# Patient Record
Sex: Female | Born: 1970 | Race: Black or African American | Hispanic: No | Marital: Single | Smoking: Never smoker
Health system: Southern US, Community
[De-identification: ages and names within clinical notes are randomized; demographics above are authoritative.]

## PROBLEM LIST (undated history)

## (undated) DIAGNOSIS — R112 Nausea with vomiting, unspecified: Secondary | ICD-10-CM

## (undated) DIAGNOSIS — Z9889 Other specified postprocedural states: Secondary | ICD-10-CM

## (undated) DIAGNOSIS — Z21 Asymptomatic human immunodeficiency virus [HIV] infection status: Secondary | ICD-10-CM

## (undated) DIAGNOSIS — D649 Anemia, unspecified: Secondary | ICD-10-CM

## (undated) DIAGNOSIS — I10 Essential (primary) hypertension: Secondary | ICD-10-CM

## (undated) DIAGNOSIS — J45909 Unspecified asthma, uncomplicated: Secondary | ICD-10-CM

## (undated) HISTORY — PX: MYOMECTOMY: SHX85

---

## 1998-09-15 ENCOUNTER — Other Ambulatory Visit: Admission: RE | Admit: 1998-09-15 | Discharge: 1998-09-15 | Payer: Self-pay | Admitting: Obstetrics

## 1999-04-29 ENCOUNTER — Other Ambulatory Visit: Admission: RE | Admit: 1999-04-29 | Discharge: 1999-04-29 | Payer: Self-pay | Admitting: Obstetrics

## 1999-06-15 ENCOUNTER — Encounter (HOSPITAL_COMMUNITY): Payer: Self-pay | Admitting: Oncology

## 1999-06-15 ENCOUNTER — Ambulatory Visit (HOSPITAL_COMMUNITY): Admission: RE | Admit: 1999-06-15 | Discharge: 1999-06-15 | Payer: Self-pay | Admitting: Oncology

## 1999-08-02 ENCOUNTER — Emergency Department (HOSPITAL_COMMUNITY): Admission: EM | Admit: 1999-08-02 | Discharge: 1999-08-02 | Payer: Self-pay | Admitting: Emergency Medicine

## 1999-08-02 ENCOUNTER — Encounter: Payer: Self-pay | Admitting: Emergency Medicine

## 1999-09-10 ENCOUNTER — Encounter (INDEPENDENT_AMBULATORY_CARE_PROVIDER_SITE_OTHER): Payer: Self-pay | Admitting: *Deleted

## 1999-09-10 ENCOUNTER — Ambulatory Visit (HOSPITAL_COMMUNITY): Admission: RE | Admit: 1999-09-10 | Discharge: 1999-09-10 | Payer: Self-pay | Admitting: Internal Medicine

## 1999-09-10 ENCOUNTER — Encounter: Admission: RE | Admit: 1999-09-10 | Discharge: 1999-09-10 | Payer: Self-pay | Admitting: Internal Medicine

## 1999-09-10 LAB — CONVERTED CEMR LAB: CD4 Count: 9 microliters

## 1999-10-02 ENCOUNTER — Emergency Department (HOSPITAL_COMMUNITY): Admission: EM | Admit: 1999-10-02 | Discharge: 1999-10-02 | Payer: Self-pay | Admitting: Emergency Medicine

## 1999-10-02 ENCOUNTER — Encounter: Payer: Self-pay | Admitting: Emergency Medicine

## 1999-10-05 ENCOUNTER — Encounter: Admission: RE | Admit: 1999-10-05 | Discharge: 1999-10-05 | Payer: Self-pay | Admitting: Internal Medicine

## 1999-10-11 ENCOUNTER — Encounter: Admission: RE | Admit: 1999-10-11 | Discharge: 1999-10-11 | Payer: Self-pay | Admitting: Internal Medicine

## 1999-10-15 ENCOUNTER — Encounter: Admission: RE | Admit: 1999-10-15 | Discharge: 1999-10-15 | Payer: Self-pay | Admitting: Internal Medicine

## 1999-10-15 ENCOUNTER — Ambulatory Visit (HOSPITAL_COMMUNITY): Admission: RE | Admit: 1999-10-15 | Discharge: 1999-10-15 | Payer: Self-pay | Admitting: Internal Medicine

## 1999-10-26 ENCOUNTER — Ambulatory Visit (HOSPITAL_COMMUNITY): Admission: RE | Admit: 1999-10-26 | Discharge: 1999-10-26 | Payer: Self-pay | Admitting: Internal Medicine

## 1999-10-26 ENCOUNTER — Encounter: Payer: Self-pay | Admitting: Internal Medicine

## 1999-10-26 ENCOUNTER — Encounter: Admission: RE | Admit: 1999-10-26 | Discharge: 1999-10-26 | Payer: Self-pay | Admitting: Internal Medicine

## 1999-11-03 ENCOUNTER — Encounter: Admission: RE | Admit: 1999-11-03 | Discharge: 1999-11-03 | Payer: Self-pay | Admitting: Internal Medicine

## 1999-11-03 ENCOUNTER — Encounter (INDEPENDENT_AMBULATORY_CARE_PROVIDER_SITE_OTHER): Payer: Self-pay | Admitting: *Deleted

## 1999-11-03 ENCOUNTER — Ambulatory Visit (HOSPITAL_COMMUNITY): Admission: RE | Admit: 1999-11-03 | Discharge: 1999-11-03 | Payer: Self-pay | Admitting: Internal Medicine

## 1999-11-03 ENCOUNTER — Inpatient Hospital Stay (HOSPITAL_COMMUNITY): Admission: RE | Admit: 1999-11-03 | Discharge: 1999-11-19 | Payer: Self-pay | Admitting: Infectious Diseases

## 1999-11-04 ENCOUNTER — Encounter: Payer: Self-pay | Admitting: Infectious Diseases

## 1999-11-05 ENCOUNTER — Encounter: Payer: Self-pay | Admitting: Infectious Diseases

## 1999-11-08 ENCOUNTER — Encounter: Payer: Self-pay | Admitting: Infectious Diseases

## 1999-11-09 ENCOUNTER — Encounter: Payer: Self-pay | Admitting: Infectious Diseases

## 1999-11-15 ENCOUNTER — Encounter: Payer: Self-pay | Admitting: Infectious Diseases

## 1999-11-16 ENCOUNTER — Encounter: Payer: Self-pay | Admitting: Infectious Diseases

## 1999-11-22 ENCOUNTER — Encounter: Admission: RE | Admit: 1999-11-22 | Discharge: 1999-11-22 | Payer: Self-pay | Admitting: Internal Medicine

## 1999-11-22 ENCOUNTER — Ambulatory Visit (HOSPITAL_COMMUNITY): Admission: RE | Admit: 1999-11-22 | Discharge: 1999-11-22 | Payer: Self-pay | Admitting: Internal Medicine

## 1999-11-25 ENCOUNTER — Ambulatory Visit (HOSPITAL_COMMUNITY): Admission: RE | Admit: 1999-11-25 | Discharge: 1999-11-25 | Payer: Self-pay | Admitting: Internal Medicine

## 1999-11-25 ENCOUNTER — Encounter: Admission: RE | Admit: 1999-11-25 | Discharge: 1999-11-25 | Payer: Self-pay | Admitting: Internal Medicine

## 1999-11-29 ENCOUNTER — Ambulatory Visit (HOSPITAL_COMMUNITY): Admission: RE | Admit: 1999-11-29 | Discharge: 1999-11-29 | Payer: Self-pay | Admitting: Internal Medicine

## 1999-11-30 ENCOUNTER — Encounter: Admission: RE | Admit: 1999-11-30 | Discharge: 1999-11-30 | Payer: Self-pay | Admitting: Internal Medicine

## 1999-12-01 ENCOUNTER — Ambulatory Visit (HOSPITAL_COMMUNITY): Admission: RE | Admit: 1999-12-01 | Discharge: 1999-12-01 | Payer: Self-pay | Admitting: Internal Medicine

## 1999-12-01 ENCOUNTER — Encounter: Payer: Self-pay | Admitting: Internal Medicine

## 1999-12-14 ENCOUNTER — Ambulatory Visit (HOSPITAL_COMMUNITY): Admission: RE | Admit: 1999-12-14 | Discharge: 1999-12-14 | Payer: Self-pay | Admitting: Internal Medicine

## 1999-12-28 ENCOUNTER — Ambulatory Visit (HOSPITAL_COMMUNITY): Admission: RE | Admit: 1999-12-28 | Discharge: 1999-12-28 | Payer: Self-pay | Admitting: Internal Medicine

## 1999-12-28 ENCOUNTER — Encounter: Admission: RE | Admit: 1999-12-28 | Discharge: 1999-12-28 | Payer: Self-pay | Admitting: Internal Medicine

## 2000-02-01 ENCOUNTER — Ambulatory Visit (HOSPITAL_COMMUNITY): Admission: RE | Admit: 2000-02-01 | Discharge: 2000-02-01 | Payer: Self-pay | Admitting: Internal Medicine

## 2000-02-01 ENCOUNTER — Encounter: Admission: RE | Admit: 2000-02-01 | Discharge: 2000-02-01 | Payer: Self-pay | Admitting: Internal Medicine

## 2000-02-22 ENCOUNTER — Encounter: Admission: RE | Admit: 2000-02-22 | Discharge: 2000-02-22 | Payer: Self-pay | Admitting: Internal Medicine

## 2000-03-21 ENCOUNTER — Ambulatory Visit (HOSPITAL_COMMUNITY): Admission: RE | Admit: 2000-03-21 | Discharge: 2000-03-21 | Payer: Self-pay | Admitting: Internal Medicine

## 2000-03-21 ENCOUNTER — Encounter: Admission: RE | Admit: 2000-03-21 | Discharge: 2000-03-21 | Payer: Self-pay | Admitting: Internal Medicine

## 2000-05-09 ENCOUNTER — Encounter: Admission: RE | Admit: 2000-05-09 | Discharge: 2000-05-09 | Payer: Self-pay | Admitting: Internal Medicine

## 2000-06-26 ENCOUNTER — Encounter: Admission: RE | Admit: 2000-06-26 | Discharge: 2000-06-26 | Payer: Self-pay | Admitting: Internal Medicine

## 2000-06-26 ENCOUNTER — Ambulatory Visit (HOSPITAL_COMMUNITY): Admission: RE | Admit: 2000-06-26 | Discharge: 2000-06-26 | Payer: Self-pay | Admitting: Internal Medicine

## 2000-07-11 ENCOUNTER — Encounter: Admission: RE | Admit: 2000-07-11 | Discharge: 2000-07-11 | Payer: Self-pay | Admitting: Internal Medicine

## 2000-09-19 ENCOUNTER — Other Ambulatory Visit: Admission: RE | Admit: 2000-09-19 | Discharge: 2000-09-19 | Payer: Self-pay | Admitting: Obstetrics

## 2000-09-26 ENCOUNTER — Ambulatory Visit (HOSPITAL_COMMUNITY): Admission: RE | Admit: 2000-09-26 | Discharge: 2000-09-26 | Payer: Self-pay | Admitting: Internal Medicine

## 2000-09-26 ENCOUNTER — Encounter: Admission: RE | Admit: 2000-09-26 | Discharge: 2000-09-26 | Payer: Self-pay | Admitting: Internal Medicine

## 2000-10-10 ENCOUNTER — Encounter: Admission: RE | Admit: 2000-10-10 | Discharge: 2000-10-10 | Payer: Self-pay | Admitting: Internal Medicine

## 2000-11-20 ENCOUNTER — Ambulatory Visit (HOSPITAL_COMMUNITY): Admission: RE | Admit: 2000-11-20 | Discharge: 2000-11-20 | Payer: Self-pay | Admitting: Internal Medicine

## 2000-11-20 ENCOUNTER — Encounter: Admission: RE | Admit: 2000-11-20 | Discharge: 2000-11-20 | Payer: Self-pay | Admitting: Internal Medicine

## 2001-01-29 ENCOUNTER — Encounter: Admission: RE | Admit: 2001-01-29 | Discharge: 2001-01-29 | Payer: Self-pay | Admitting: Internal Medicine

## 2001-01-29 ENCOUNTER — Ambulatory Visit (HOSPITAL_COMMUNITY): Admission: RE | Admit: 2001-01-29 | Discharge: 2001-01-29 | Payer: Self-pay | Admitting: Internal Medicine

## 2001-02-13 ENCOUNTER — Encounter: Admission: RE | Admit: 2001-02-13 | Discharge: 2001-02-13 | Payer: Self-pay | Admitting: Internal Medicine

## 2001-03-13 ENCOUNTER — Encounter: Admission: RE | Admit: 2001-03-13 | Discharge: 2001-03-13 | Payer: Self-pay | Admitting: Obstetrics & Gynecology

## 2001-04-11 ENCOUNTER — Ambulatory Visit (HOSPITAL_COMMUNITY): Admission: RE | Admit: 2001-04-11 | Discharge: 2001-04-11 | Payer: Self-pay | Admitting: Internal Medicine

## 2001-04-11 ENCOUNTER — Encounter: Admission: RE | Admit: 2001-04-11 | Discharge: 2001-04-11 | Payer: Self-pay

## 2001-05-08 ENCOUNTER — Encounter: Admission: RE | Admit: 2001-05-08 | Discharge: 2001-05-08 | Payer: Self-pay | Admitting: Internal Medicine

## 2001-08-14 ENCOUNTER — Encounter: Admission: RE | Admit: 2001-08-14 | Discharge: 2001-08-14 | Payer: Self-pay | Admitting: Internal Medicine

## 2001-08-14 ENCOUNTER — Ambulatory Visit (HOSPITAL_COMMUNITY): Admission: RE | Admit: 2001-08-14 | Discharge: 2001-08-14 | Payer: Self-pay | Admitting: Internal Medicine

## 2001-08-28 ENCOUNTER — Encounter: Admission: RE | Admit: 2001-08-28 | Discharge: 2001-08-28 | Payer: Self-pay | Admitting: Internal Medicine

## 2001-11-13 ENCOUNTER — Ambulatory Visit (HOSPITAL_COMMUNITY): Admission: RE | Admit: 2001-11-13 | Discharge: 2001-11-13 | Payer: Self-pay | Admitting: Internal Medicine

## 2001-11-13 ENCOUNTER — Encounter: Admission: RE | Admit: 2001-11-13 | Discharge: 2001-11-13 | Payer: Self-pay | Admitting: Internal Medicine

## 2001-11-27 ENCOUNTER — Encounter: Admission: RE | Admit: 2001-11-27 | Discharge: 2001-11-27 | Payer: Self-pay | Admitting: Internal Medicine

## 2001-12-25 ENCOUNTER — Encounter: Admission: RE | Admit: 2001-12-25 | Discharge: 2001-12-25 | Payer: Self-pay | Admitting: Internal Medicine

## 2002-02-27 ENCOUNTER — Encounter: Admission: RE | Admit: 2002-02-27 | Discharge: 2002-02-27 | Payer: Self-pay | Admitting: Internal Medicine

## 2002-02-27 ENCOUNTER — Ambulatory Visit (HOSPITAL_COMMUNITY): Admission: RE | Admit: 2002-02-27 | Discharge: 2002-02-27 | Payer: Self-pay | Admitting: Internal Medicine

## 2002-03-13 ENCOUNTER — Encounter: Admission: RE | Admit: 2002-03-13 | Discharge: 2002-03-13 | Payer: Self-pay | Admitting: Internal Medicine

## 2002-04-03 ENCOUNTER — Ambulatory Visit (HOSPITAL_COMMUNITY): Admission: RE | Admit: 2002-04-03 | Discharge: 2002-04-03 | Payer: Self-pay | Admitting: Internal Medicine

## 2002-04-03 ENCOUNTER — Encounter: Admission: RE | Admit: 2002-04-03 | Discharge: 2002-04-03 | Payer: Self-pay | Admitting: Internal Medicine

## 2002-05-28 ENCOUNTER — Encounter: Admission: RE | Admit: 2002-05-28 | Discharge: 2002-05-28 | Payer: Self-pay | Admitting: Internal Medicine

## 2002-05-28 ENCOUNTER — Ambulatory Visit (HOSPITAL_COMMUNITY): Admission: RE | Admit: 2002-05-28 | Discharge: 2002-05-28 | Payer: Self-pay | Admitting: Internal Medicine

## 2002-06-11 ENCOUNTER — Encounter: Admission: RE | Admit: 2002-06-11 | Discharge: 2002-06-11 | Payer: Self-pay | Admitting: Internal Medicine

## 2002-12-05 ENCOUNTER — Ambulatory Visit (HOSPITAL_COMMUNITY): Admission: RE | Admit: 2002-12-05 | Discharge: 2002-12-05 | Payer: Self-pay | Admitting: Internal Medicine

## 2002-12-05 ENCOUNTER — Encounter: Payer: Self-pay | Admitting: Internal Medicine

## 2002-12-05 ENCOUNTER — Encounter: Admission: RE | Admit: 2002-12-05 | Discharge: 2002-12-05 | Payer: Self-pay | Admitting: Internal Medicine

## 2002-12-19 ENCOUNTER — Encounter: Admission: RE | Admit: 2002-12-19 | Discharge: 2002-12-19 | Payer: Self-pay | Admitting: Internal Medicine

## 2003-04-03 ENCOUNTER — Encounter: Payer: Self-pay | Admitting: Internal Medicine

## 2003-04-03 ENCOUNTER — Encounter: Admission: RE | Admit: 2003-04-03 | Discharge: 2003-04-03 | Payer: Self-pay | Admitting: Internal Medicine

## 2003-04-03 ENCOUNTER — Ambulatory Visit (HOSPITAL_COMMUNITY): Admission: RE | Admit: 2003-04-03 | Discharge: 2003-04-03 | Payer: Self-pay | Admitting: Internal Medicine

## 2003-04-22 ENCOUNTER — Encounter: Admission: RE | Admit: 2003-04-22 | Discharge: 2003-04-22 | Payer: Self-pay | Admitting: Internal Medicine

## 2003-08-15 ENCOUNTER — Encounter: Admission: RE | Admit: 2003-08-15 | Discharge: 2003-08-15 | Payer: Self-pay | Admitting: Family Medicine

## 2003-10-07 ENCOUNTER — Encounter: Admission: RE | Admit: 2003-10-07 | Discharge: 2003-10-07 | Payer: Self-pay | Admitting: Internal Medicine

## 2003-10-07 ENCOUNTER — Ambulatory Visit (HOSPITAL_COMMUNITY): Admission: RE | Admit: 2003-10-07 | Discharge: 2003-10-07 | Payer: Self-pay | Admitting: Internal Medicine

## 2003-10-21 ENCOUNTER — Encounter: Admission: RE | Admit: 2003-10-21 | Discharge: 2003-10-21 | Payer: Self-pay | Admitting: Internal Medicine

## 2004-04-07 ENCOUNTER — Ambulatory Visit (HOSPITAL_COMMUNITY): Admission: RE | Admit: 2004-04-07 | Discharge: 2004-04-07 | Payer: Self-pay | Admitting: Internal Medicine

## 2004-04-07 ENCOUNTER — Ambulatory Visit: Payer: Self-pay | Admitting: Internal Medicine

## 2004-05-04 ENCOUNTER — Ambulatory Visit: Payer: Self-pay | Admitting: Internal Medicine

## 2004-11-22 ENCOUNTER — Ambulatory Visit (HOSPITAL_COMMUNITY): Admission: RE | Admit: 2004-11-22 | Discharge: 2004-11-22 | Payer: Self-pay | Admitting: Internal Medicine

## 2004-11-22 ENCOUNTER — Ambulatory Visit: Payer: Self-pay | Admitting: Internal Medicine

## 2004-12-07 ENCOUNTER — Ambulatory Visit: Payer: Self-pay | Admitting: Internal Medicine

## 2005-05-17 ENCOUNTER — Ambulatory Visit: Payer: Self-pay | Admitting: Internal Medicine

## 2005-05-17 ENCOUNTER — Encounter (INDEPENDENT_AMBULATORY_CARE_PROVIDER_SITE_OTHER): Payer: Self-pay | Admitting: *Deleted

## 2005-05-17 ENCOUNTER — Ambulatory Visit (HOSPITAL_COMMUNITY): Admission: RE | Admit: 2005-05-17 | Discharge: 2005-05-17 | Payer: Self-pay | Admitting: Internal Medicine

## 2005-05-31 ENCOUNTER — Ambulatory Visit: Payer: Self-pay | Admitting: Internal Medicine

## 2006-01-02 ENCOUNTER — Encounter (INDEPENDENT_AMBULATORY_CARE_PROVIDER_SITE_OTHER): Payer: Self-pay | Admitting: *Deleted

## 2006-01-02 ENCOUNTER — Encounter: Admission: RE | Admit: 2006-01-02 | Discharge: 2006-01-02 | Payer: Self-pay | Admitting: Internal Medicine

## 2006-01-02 ENCOUNTER — Ambulatory Visit: Payer: Self-pay | Admitting: Internal Medicine

## 2006-01-02 LAB — CONVERTED CEMR LAB
CD4 Count: 710 microliters
HIV 1 RNA Quant: 399 copies/mL

## 2006-01-16 ENCOUNTER — Ambulatory Visit: Payer: Self-pay | Admitting: Internal Medicine

## 2006-06-26 ENCOUNTER — Ambulatory Visit: Payer: Self-pay | Admitting: Internal Medicine

## 2006-06-26 ENCOUNTER — Encounter: Admission: RE | Admit: 2006-06-26 | Discharge: 2006-06-26 | Payer: Self-pay | Admitting: Internal Medicine

## 2006-06-26 ENCOUNTER — Encounter (INDEPENDENT_AMBULATORY_CARE_PROVIDER_SITE_OTHER): Payer: Self-pay | Admitting: *Deleted

## 2006-06-26 LAB — CONVERTED CEMR LAB
AST: 17 units/L (ref 0–37)
Albumin: 4.2 g/dL (ref 3.5–5.2)
Alkaline Phosphatase: 55 units/L (ref 39–117)
BUN: 15 mg/dL (ref 6–23)
CD4 Count: 690 microliters
Calcium: 9 mg/dL (ref 8.4–10.5)
Chloride: 106 meq/L (ref 96–112)
Glucose, Bld: 80 mg/dL (ref 70–99)
HDL: 62 mg/dL (ref >39)
HIV 1 RNA Quant: 49 copies/mL
HIV-1 RNA Quant, Log: <1.7 (ref <1.70)
LDL Cholesterol: 73 mg/dL (ref 0–99)
Lymphocytes Relative: 42 % (ref 12–46)
Lymphs Abs: 2.2 10*3/uL (ref 0.7–3.3)
Neutrophils Relative %: 51 % (ref 43–77)
Platelets: 266 10*3/uL (ref 150–400)
Potassium: 3.7 meq/L (ref 3.5–5.3)
RDW: 12.7 % (ref 11.5–14.0)
Sodium: 141 meq/L (ref 135–145)
Total Protein: 7.2 g/dL (ref 6.0–8.3)
WBC: 5.1 10*3/uL (ref 4.0–10.5)

## 2006-07-11 ENCOUNTER — Ambulatory Visit: Payer: Self-pay | Admitting: Internal Medicine

## 2006-07-12 ENCOUNTER — Encounter: Payer: Self-pay | Admitting: Internal Medicine

## 2006-08-10 ENCOUNTER — Encounter: Payer: Self-pay | Admitting: Internal Medicine

## 2006-08-10 DIAGNOSIS — F329 Major depressive disorder, single episode, unspecified: Secondary | ICD-10-CM

## 2006-08-10 DIAGNOSIS — N920 Excessive and frequent menstruation with regular cycle: Secondary | ICD-10-CM | POA: Insufficient documentation

## 2006-08-10 DIAGNOSIS — Z862 Personal history of diseases of the blood and blood-forming organs and certain disorders involving the immune mechanism: Secondary | ICD-10-CM | POA: Insufficient documentation

## 2006-08-10 DIAGNOSIS — Z87898 Personal history of other specified conditions: Secondary | ICD-10-CM | POA: Insufficient documentation

## 2006-08-10 DIAGNOSIS — A630 Anogenital (venereal) warts: Secondary | ICD-10-CM | POA: Insufficient documentation

## 2006-08-10 DIAGNOSIS — M4120 Other idiopathic scoliosis, site unspecified: Secondary | ICD-10-CM | POA: Insufficient documentation

## 2006-08-10 DIAGNOSIS — D72819 Decreased white blood cell count, unspecified: Secondary | ICD-10-CM | POA: Insufficient documentation

## 2006-08-10 DIAGNOSIS — J45909 Unspecified asthma, uncomplicated: Secondary | ICD-10-CM | POA: Insufficient documentation

## 2006-08-10 DIAGNOSIS — F32A Depression, unspecified: Secondary | ICD-10-CM | POA: Insufficient documentation

## 2006-08-10 LAB — CONVERTED CEMR LAB
Hep B S Ab: NEGATIVE
Hepatitis B Surface Ag: NEGATIVE

## 2006-08-14 ENCOUNTER — Encounter (INDEPENDENT_AMBULATORY_CARE_PROVIDER_SITE_OTHER): Payer: Self-pay | Admitting: *Deleted

## 2006-08-14 ENCOUNTER — Encounter: Payer: Self-pay | Admitting: Internal Medicine

## 2006-08-14 LAB — CONVERTED CEMR LAB

## 2006-08-27 ENCOUNTER — Encounter (INDEPENDENT_AMBULATORY_CARE_PROVIDER_SITE_OTHER): Payer: Self-pay | Admitting: *Deleted

## 2006-10-19 ENCOUNTER — Telehealth: Payer: Self-pay | Admitting: Internal Medicine

## 2006-12-25 ENCOUNTER — Encounter: Admission: RE | Admit: 2006-12-25 | Discharge: 2006-12-25 | Payer: Self-pay | Admitting: Internal Medicine

## 2006-12-25 ENCOUNTER — Ambulatory Visit: Payer: Self-pay | Admitting: Internal Medicine

## 2006-12-26 ENCOUNTER — Encounter: Payer: Self-pay | Admitting: Internal Medicine

## 2006-12-26 ENCOUNTER — Encounter (INDEPENDENT_AMBULATORY_CARE_PROVIDER_SITE_OTHER): Payer: Self-pay | Admitting: *Deleted

## 2007-01-10 ENCOUNTER — Ambulatory Visit: Payer: Self-pay | Admitting: Internal Medicine

## 2007-01-10 DIAGNOSIS — B20 Human immunodeficiency virus [HIV] disease: Secondary | ICD-10-CM | POA: Insufficient documentation

## 2007-01-11 ENCOUNTER — Encounter (INDEPENDENT_AMBULATORY_CARE_PROVIDER_SITE_OTHER): Payer: Self-pay | Admitting: *Deleted

## 2007-01-19 ENCOUNTER — Encounter (INDEPENDENT_AMBULATORY_CARE_PROVIDER_SITE_OTHER): Payer: Self-pay | Admitting: *Deleted

## 2007-04-11 ENCOUNTER — Telehealth: Payer: Self-pay | Admitting: Internal Medicine

## 2007-04-12 ENCOUNTER — Telehealth: Payer: Self-pay | Admitting: Internal Medicine

## 2007-05-07 ENCOUNTER — Ambulatory Visit (HOSPITAL_COMMUNITY): Admission: RE | Admit: 2007-05-07 | Discharge: 2007-05-07 | Payer: Self-pay | Admitting: Obstetrics

## 2007-05-23 ENCOUNTER — Encounter (INDEPENDENT_AMBULATORY_CARE_PROVIDER_SITE_OTHER): Payer: Self-pay | Admitting: Obstetrics

## 2007-05-23 ENCOUNTER — Inpatient Hospital Stay (HOSPITAL_COMMUNITY): Admission: RE | Admit: 2007-05-23 | Discharge: 2007-05-25 | Payer: Self-pay | Admitting: Obstetrics

## 2007-06-18 ENCOUNTER — Encounter (INDEPENDENT_AMBULATORY_CARE_PROVIDER_SITE_OTHER): Payer: Self-pay | Admitting: *Deleted

## 2007-06-19 ENCOUNTER — Encounter (INDEPENDENT_AMBULATORY_CARE_PROVIDER_SITE_OTHER): Payer: Self-pay | Admitting: *Deleted

## 2007-06-26 ENCOUNTER — Ambulatory Visit: Payer: Self-pay | Admitting: Internal Medicine

## 2007-06-26 ENCOUNTER — Encounter: Admission: RE | Admit: 2007-06-26 | Discharge: 2007-06-26 | Payer: Self-pay | Admitting: Internal Medicine

## 2007-06-26 LAB — CONVERTED CEMR LAB
Albumin: 4.6 g/dL (ref 3.5–5.2)
BUN: 15 mg/dL (ref 6–23)
CO2: 24 meq/L (ref 19–32)
Cholesterol: 145 mg/dL (ref 0–200)
Glucose, Bld: 99 mg/dL (ref 70–99)
HIV-1 RNA Quant, Log: <1.7 (ref <1.70)
Hemoglobin: 13.8 g/dL (ref 12.0–15.0)
MCV: 93.8 fL (ref 78.0–100.0)
Potassium: 4.1 meq/L (ref 3.5–5.3)
RBC: 4.37 M/uL (ref 3.87–5.11)
Sodium: 140 meq/L (ref 135–145)
Total Bilirubin: 0.4 mg/dL (ref 0.3–1.2)
Total Protein: 7.6 g/dL (ref 6.0–8.3)
Triglycerides: 59 mg/dL (ref <150)
WBC: 6 10*3/uL (ref 4.0–10.5)

## 2007-07-10 ENCOUNTER — Ambulatory Visit: Payer: Self-pay | Admitting: Internal Medicine

## 2007-07-11 DIAGNOSIS — D219 Benign neoplasm of connective and other soft tissue, unspecified: Secondary | ICD-10-CM | POA: Insufficient documentation

## 2007-07-20 ENCOUNTER — Encounter: Payer: Self-pay | Admitting: Internal Medicine

## 2007-09-25 ENCOUNTER — Telehealth (INDEPENDENT_AMBULATORY_CARE_PROVIDER_SITE_OTHER): Payer: Self-pay | Admitting: *Deleted

## 2007-10-02 ENCOUNTER — Encounter: Payer: Self-pay | Admitting: Internal Medicine

## 2007-10-09 ENCOUNTER — Telehealth: Payer: Self-pay

## 2007-12-07 ENCOUNTER — Ambulatory Visit: Payer: Self-pay | Admitting: Internal Medicine

## 2007-12-07 ENCOUNTER — Encounter: Admission: RE | Admit: 2007-12-07 | Discharge: 2007-12-07 | Payer: Self-pay | Admitting: Internal Medicine

## 2007-12-07 LAB — CONVERTED CEMR LAB
BUN: 12 mg/dL (ref 6–23)
CO2: 22 meq/L (ref 19–32)
Creatinine, Ser: 0.74 mg/dL (ref 0.40–1.20)
Eosinophils Relative: 1 % (ref 0–5)
Glucose, Bld: 78 mg/dL (ref 70–99)
HCT: 42.2 % (ref 36.0–46.0)
HIV 1 RNA Quant: <50 copies/mL (ref <50)
HIV-1 RNA Quant, Log: <1.7 (ref <1.70)
Hemoglobin: 14.4 g/dL (ref 12.0–15.0)
Lymphocytes Relative: 33 % (ref 12–46)
Lymphs Abs: 1.8 10*3/uL (ref 0.7–4.0)
Monocytes Absolute: 0.2 10*3/uL (ref 0.1–1.0)
Monocytes Relative: 4 % (ref 3–12)
RBC: 4.67 M/uL (ref 3.87–5.11)
Sodium: 141 meq/L (ref 135–145)
Total Bilirubin: 0.5 mg/dL (ref 0.3–1.2)
Total Protein: 7 g/dL (ref 6.0–8.3)
WBC: 5.5 10*3/uL (ref 4.0–10.5)

## 2007-12-18 ENCOUNTER — Encounter (INDEPENDENT_AMBULATORY_CARE_PROVIDER_SITE_OTHER): Payer: Self-pay | Admitting: *Deleted

## 2007-12-25 ENCOUNTER — Encounter (INDEPENDENT_AMBULATORY_CARE_PROVIDER_SITE_OTHER): Payer: Self-pay | Admitting: *Deleted

## 2007-12-25 ENCOUNTER — Ambulatory Visit: Payer: Self-pay | Admitting: Internal Medicine

## 2007-12-25 LAB — CONVERTED CEMR LAB: Pap Smear: NORMAL

## 2008-01-03 ENCOUNTER — Ambulatory Visit (HOSPITAL_COMMUNITY): Admission: RE | Admit: 2008-01-03 | Discharge: 2008-01-04 | Payer: Self-pay | Admitting: Obstetrics

## 2008-01-03 ENCOUNTER — Encounter: Payer: Self-pay | Admitting: Internal Medicine

## 2008-06-18 ENCOUNTER — Ambulatory Visit: Payer: Self-pay | Admitting: Internal Medicine

## 2008-06-18 LAB — CONVERTED CEMR LAB
ALT: 11 units/L (ref 0–35)
AST: 14 units/L (ref 0–37)
Albumin: 4.4 g/dL (ref 3.5–5.2)
BUN: 15 mg/dL (ref 6–23)
Basophils Absolute: 0 10*3/uL (ref 0.0–0.1)
Basophils Relative: 0 % (ref 0–1)
Calcium: 8.7 mg/dL (ref 8.4–10.5)
Chloride: 103 meq/L (ref 96–112)
HIV 1 RNA Quant: <48 copies/mL (ref <48)
HIV-1 RNA Quant, Log: <1.68 (ref <1.68)
MCHC: 34 g/dL (ref 30.0–36.0)
Monocytes Relative: 7 % (ref 3–12)
Neutro Abs: 3 10*3/uL (ref 1.7–7.7)
Neutrophils Relative %: 54 % (ref 43–77)
Potassium: 3.8 meq/L (ref 3.5–5.3)
RBC: 4.56 M/uL (ref 3.87–5.11)
Total Protein: 7.4 g/dL (ref 6.0–8.3)

## 2008-07-08 ENCOUNTER — Ambulatory Visit: Payer: Self-pay | Admitting: Internal Medicine

## 2008-07-23 ENCOUNTER — Encounter (INDEPENDENT_AMBULATORY_CARE_PROVIDER_SITE_OTHER): Payer: Self-pay | Admitting: *Deleted

## 2008-12-10 ENCOUNTER — Ambulatory Visit: Payer: Self-pay | Admitting: Internal Medicine

## 2008-12-10 LAB — CONVERTED CEMR LAB
ALT: 14 units/L (ref 0–35)
Alkaline Phosphatase: 67 units/L (ref 39–117)
Basophils Absolute: 0 10*3/uL (ref 0.0–0.1)
Eosinophils Relative: 1 % (ref 0–5)
GFR calc non Af Amer: >60 mL/min (ref >60)
HCT: 40.7 % (ref 36.0–46.0)
HIV-1 RNA Quant, Log: <1.68 (ref <1.68)
Hemoglobin: 13.8 g/dL (ref 12.0–15.0)
LDL Cholesterol: 84 mg/dL (ref 0–99)
Lymphocytes Relative: 35 % (ref 12–46)
MCHC: 33.9 g/dL (ref 30.0–36.0)
Monocytes Absolute: 0.4 10*3/uL (ref 0.1–1.0)
Monocytes Relative: 6 % (ref 3–12)
RBC: 4.47 M/uL (ref 3.87–5.11)
RDW: 12.8 % (ref 11.5–15.5)
Sodium: 142 meq/L (ref 135–145)
Total Bilirubin: 0.5 mg/dL (ref 0.3–1.2)
Total CHOL/HDL Ratio: 2.5
Total Protein: 7.5 g/dL (ref 6.0–8.3)
Triglycerides: 43 mg/dL (ref <150)
VLDL: 9 mg/dL (ref 0–40)

## 2008-12-23 ENCOUNTER — Ambulatory Visit: Payer: Self-pay | Admitting: Internal Medicine

## 2008-12-29 ENCOUNTER — Encounter (INDEPENDENT_AMBULATORY_CARE_PROVIDER_SITE_OTHER): Payer: Self-pay | Admitting: *Deleted

## 2008-12-31 ENCOUNTER — Encounter: Payer: Self-pay | Admitting: Internal Medicine

## 2009-01-06 ENCOUNTER — Encounter (INDEPENDENT_AMBULATORY_CARE_PROVIDER_SITE_OTHER): Payer: Self-pay | Admitting: *Deleted

## 2009-02-03 ENCOUNTER — Encounter (INDEPENDENT_AMBULATORY_CARE_PROVIDER_SITE_OTHER): Payer: Self-pay | Admitting: *Deleted

## 2009-04-09 ENCOUNTER — Encounter: Payer: Self-pay | Admitting: Internal Medicine

## 2009-07-07 ENCOUNTER — Ambulatory Visit: Payer: Self-pay | Admitting: Internal Medicine

## 2009-07-07 LAB — CONVERTED CEMR LAB
AST: 18 units/L (ref 0–37)
Albumin: 4.7 g/dL (ref 3.5–5.2)
BUN: 17 mg/dL (ref 6–23)
Basophils Absolute: 0 10*3/uL (ref 0.0–0.1)
CO2: 23 meq/L (ref 19–32)
Calcium: 9.7 mg/dL (ref 8.4–10.5)
Chloride: 104 meq/L (ref 96–112)
Cholesterol: 163 mg/dL (ref 0–200)
Creatinine, Ser: 0.87 mg/dL (ref 0.40–1.20)
Eosinophils Relative: 1 % (ref 0–5)
Glucose, Bld: 89 mg/dL (ref 70–99)
HCT: 40.9 % (ref 36.0–46.0)
HDL: 71 mg/dL (ref >39)
HIV-1 RNA Quant, Log: <1.68 (ref <1.68)
Hemoglobin: 13.7 g/dL (ref 12.0–15.0)
LDL Cholesterol: 84 mg/dL (ref 0–99)
Lymphocytes Relative: 35 % (ref 12–46)
Lymphs Abs: 2.6 10*3/uL (ref 0.7–4.0)
Monocytes Absolute: 0.4 10*3/uL (ref 0.1–1.0)
Monocytes Relative: 6 % (ref 3–12)
Neutro Abs: 4.4 10*3/uL (ref 1.7–7.7)
Potassium: 3.6 meq/L (ref 3.5–5.3)
RBC: 4.51 M/uL (ref 3.87–5.11)
Triglycerides: 38 mg/dL (ref <150)
VLDL: 8 mg/dL (ref 0–40)
WBC: 7.5 10*3/uL (ref 4.0–10.5)

## 2009-07-21 ENCOUNTER — Ambulatory Visit: Payer: Self-pay | Admitting: Internal Medicine

## 2009-07-21 DIAGNOSIS — R809 Proteinuria, unspecified: Secondary | ICD-10-CM | POA: Insufficient documentation

## 2009-07-22 ENCOUNTER — Encounter: Payer: Self-pay | Admitting: Internal Medicine

## 2009-12-28 ENCOUNTER — Ambulatory Visit: Payer: Self-pay | Admitting: Internal Medicine

## 2009-12-28 LAB — CONVERTED CEMR LAB
Eosinophils Absolute: 0 10*3/uL (ref 0.0–0.7)
Eosinophils Relative: 0 % (ref 0–5)
HCT: 38.3 % (ref 36.0–46.0)
Hemoglobin: 12.6 g/dL (ref 12.0–15.0)
Lymphocytes Relative: 35 % (ref 12–46)
Lymphs Abs: 2.3 10*3/uL (ref 0.7–4.0)
MCV: 92.7 fL (ref 78.0–100.0)
Monocytes Absolute: 0.5 10*3/uL (ref 0.1–1.0)
Platelets: 314 10*3/uL (ref 150–400)
RDW: 13.3 % (ref 11.5–15.5)
WBC: 6.7 10*3/uL (ref 4.0–10.5)

## 2010-02-18 ENCOUNTER — Encounter (INDEPENDENT_AMBULATORY_CARE_PROVIDER_SITE_OTHER): Payer: Self-pay | Admitting: *Deleted

## 2010-03-08 ENCOUNTER — Ambulatory Visit: Payer: Self-pay | Admitting: Internal Medicine

## 2010-03-24 ENCOUNTER — Encounter: Payer: Self-pay | Admitting: Internal Medicine

## 2010-07-18 LAB — CONVERTED CEMR LAB
ALT: 11 units/L (ref 0–35)
Alkaline Phosphatase: 61 units/L (ref 39–117)
Basophils Absolute: 0 10*3/uL (ref 0.0–0.1)
Basophils Relative: 1 % (ref 0–1)
CO2: 27 meq/L (ref 19–32)
Creatinine, Ser: 0.8 mg/dL (ref 0.40–1.20)
Eosinophils Absolute: 0 10*3/uL (ref 0.0–0.7)
Eosinophils Relative: 1 % (ref 0–5)
HCT: 43.2 % (ref 36.0–46.0)
HIV 1 RNA Quant: <50 copies/mL (ref <50)
Hemoglobin: 14.2 g/dL (ref 12.0–15.0)
Lymphocytes Relative: 44 % (ref 12–46)
MCHC: 32.9 g/dL (ref 30.0–36.0)
MCV: 95.6 fL (ref 78.0–100.0)
Monocytes Absolute: 0.3 10*3/uL (ref 0.2–0.7)
Platelets: 275 10*3/uL (ref 150–400)
RDW: 12.4 % (ref 11.5–14.0)
Sodium: 140 meq/L (ref 135–145)
Total Bilirubin: 0.4 mg/dL (ref 0.3–1.2)

## 2010-07-22 NOTE — Miscellaneous (Signed)
Summary: clinical upate/ryan white  Clinical Lists Changes  Observations: Added new observation of PCTFPL: 578.17  (02/18/2010 14:21) Added new observation of HOUSEINCOME: 81191  (02/18/2010 14:21) Added new observation of FINASSESSDT: 12/28/2009  (02/18/2010 14:21) Added new observation of YEARLYEXPEN: 47829  (02/18/2010 14:21)

## 2010-07-22 NOTE — Assessment & Plan Note (Signed)
Summary: F/U OV/VS   CC:  follow-up visit and Depression.  History of Present Illness: Bailey Morris is in for her routine visit.  She think she's only missed one dose of her Atripla in the past 6 months when she fell asleep early.  She had a normal Pap smear this past summer by Dr. Gaynell Face.  She is still enjoying her work.  She is not dating and she is not sexually active.  Depression History:      The patient denies a depressed mood most of the day and a diminished interest in her usual daily activities.         Preventive Screening-Counseling & Management  Alcohol-Tobacco     Alcohol drinks/day: <1     Alcohol type: wine     Smoking Status: never     Passive Smoke Exposure: yes  Caffeine-Diet-Exercise     Caffeine use/day: yes     Does Patient Exercise: yes     Type of exercise: P90X, at home, FIT TV     Exercise (avg: min/session): 30-60     Times/week: 4  Hep-HIV-STD-Contraception     HIV Risk: no risk noted  Safety-Violence-Falls     Seat Belt Use: yes  Comments: declined condoms      Sexual History:  n/a.        Drug Use:  never.     Prior Medication List:  ATRIPLA 600-200-300 MG TABS (EFAVIRENZ-EMTRICITAB-TENOFOVIR) Take 1 tablet by mouth once a day XYZAL 5 MG TABS (LEVOCETIRIZINE DIHYDROCHLORIDE) Take 1 tablet by mouth once a day SINGULAIR 10 MG TABS (MONTELUKAST SODIUM) Take 1 tablet by mouth at bedtime OMNARIS 50 MCG/ACT SUSP (CICLESONIDE)    Current Allergies (reviewed today): ! SEPTRA ! * ADVAIR Vital Signs:  Patient profile:   40 year old female Menstrual status:  regular LMP:     03/02/2010 Height:      65 inches (165.10 cm) Weight:      160.75 pounds (73.07 kg) BMI:     26.85 Temp:     98.4 degrees F (36.89 degrees C) oral Pulse rate:   69 / minute BP sitting:   122 / 89  (left arm) Cuff size:   regular  Vitals Entered By: Jennet Maduro RN (March 08, 2010 3:32 PM) CC: follow-up visit, Depression Is Patient Diabetic? No Pain  Assessment Patient in pain? no      Nutritional Status BMI of 25 - 29 = overweight Nutritional Status Detail appetite "good"  Have you ever been in a relationship where you felt threatened, hurt or afraid?No   Does patient need assistance? Functional Status Self care Ambulation Normal Comments may have missed one dose of rxes LMP (date): 03/02/2010     Enter LMP: 03/02/2010 Last PAP Result Negative fo intraepithelial lesion and malignancy        Medication Adherence: 03/08/2010   Adherence to medications reviewed with patient. Counseling to provide adequate adherence provided   Prevention For Positives: 03/08/2010   Safe sex practices discussed with patient. Condoms offered.                             Physical Exam  General:  alert and well-nourished.  her weight is up 10 pounds. Mouth:  good dentition and pharynx pink and moist.  no erythema and no exudates.   Lungs:  normal breath sounds.  no crackles and no wheezes.   Heart:  normal rate, regular rhythm, and no  murmur.     Impression & Recommendations:  Problem # 1:  HIV DISEASE (ICD-042) [name]'s HIV infection remains under excellent control.  I will continue her current regimen. Diagnostics Reviewed:  HIV: CDC-defined AIDS (12/23/2008)   CD4: 710 (12/29/2009)   WBC: 6.7 (12/28/2009)   Hgb: 12.6 (12/28/2009)   HCT: 38.3 (12/28/2009)   Platelets: 314 (12/28/2009) HIV-1 RNA: <48 copies/mL (12/28/2009)   HBSAg: No (08/14/2006)  Other Orders: Est. Patient Level III (16109) Future Orders: T-CD4SP ([facility]) (CD4SP) ... 09/04/2010 T-HIV Viral Load [phone]) ... 09/04/2010 T-Comprehensive Metabolic Panel [phone]) ... 09/04/2010 T-CBC w/Diff ([id number]) ... 09/04/2010 T-RPR (Syphilis) [phone]) ... 09/04/2010 T-Lipid Profile [phone]) ... 09/04/2010  Patient Instructions: 1)  Please schedule a follow-up appointment in 6 months.    Not Administered:    Influenza Vaccine not given  Pt.  to obtain Flu vaccine at ther office later this month. Jennet Maduro RN  March 08, 2010 3:39 PM

## 2010-07-22 NOTE — Consult Note (Signed)
Summary: [facility]   [facility]   Imported By: Florinda Marker 04/06/2010 16:04:46  _____________________________________________________________________  External Attachment:    Type:   Image     Comment:   External Document

## 2010-07-22 NOTE — Consult Note (Signed)
Summary: [facility]  [facility]   Imported By: Florinda Marker 08/07/2009 14:59:05  _____________________________________________________________________  External Attachment:    Type:   Image     Comment:   External Document

## 2010-07-22 NOTE — Assessment & Plan Note (Signed)
Summary: resfrom pm to am/kamschedule dhange   CC:  follow-up visit and Depression.  History of Present Illness: Bailey Morris is in for her routine visit. She is happy that she has gotten a promotion at [employer] and now is working in Technical [employer].  She denies missing any doses of her Atripla  since her last visit.  She does want to lose some weight and hopes to get back to regular exercise.  She cut out drinking sodas as frequently as she had been recently.  She was screened for kidney disease because her brother was going under the transplant and recalls being told that she had protein in her urine.  Depression History:      The patient denies a depressed mood most of the day and a diminished interest in her usual daily activities.         Preventive Screening-Counseling & Management  Alcohol-Tobacco     Alcohol drinks/day: <1     Alcohol type: wine     Smoking Status: never     Passive Smoke Exposure: yes  Caffeine-Diet-Exercise     Caffeine use/day: yes     Does Patient Exercise: yes     Type of exercise: P90X, at home, FIT TV     Exercise (avg: min/session): 30-60     Times/week: 4  Hep-HIV-STD-Contraception     HIV Risk: no risk noted  Safety-Violence-Falls     Seat Belt Use: yes  Comments: declined condoms      Sexual History:  n/a.        Drug Use:  never.     Updated Prior Medication List: ATRIPLA 600-200-300 MG TABS (EFAVIRENZ-EMTRICITAB-TENOFOVIR) Take 1 tablet by mouth once a day XYZAL 5 MG TABS (LEVOCETIRIZINE DIHYDROCHLORIDE) Take 1 tablet by mouth once a day SINGULAIR 10 MG TABS (MONTELUKAST SODIUM) Take 1 tablet by mouth at bedtime OMNARIS 50 MCG/ACT SUSP (CICLESONIDE)   Current Allergies (reviewed today): ! SEPTRA Social History: Sexual History:  n/a  Vital Signs:  Patient profile:   40 year old female Menstrual status:  regular LMP:     06/19/2009 Height:      65 inches (165.10 cm) Weight:      156.4 pounds (71.09 kg) BMI:     26.12 Temp:      98.3 degrees F (36.83 degrees C) oral Pulse rate:   78 / minute BP sitting:   118 / 81  (right arm) Cuff size:   regular  Vitals Entered By: Jennet Maduro RN (July 21, 2009 10:25 AM) CC: follow-up visit, Depression Is Patient Diabetic? No Pain Assessment Patient in pain? no      Nutritional Status BMI of 25 - 29 = overweight Nutritional Status Detail appetite "GOOD"  Have you ever been in a relationship where you felt threatened, hurt or afraid?No   Does patient need assistance? Functional Status Self care Ambulation Normal Comments missed 2 doses of rx over 5 months ago LMP (date): 06/19/2009     Enter LMP: 06/19/2009 Last PAP Result Negative fo intraepithelial lesion and malignancy        Medication Adherence: 07/21/2009   Adherence to medications reviewed with patient. Counseling to provide adequate adherence provided                                Physical Exam  General:  alert and well-nourished.  her weight is up 10 pounds. Mouth:  good dentition and pharynx pink  and moist.  no erythema and no exudates.   Lungs:  normal breath sounds.  no crackles and no wheezes.   Heart:  normal rate, regular rhythm, and no murmur.   Skin:  no rashes.   Psych:  normally interactive, good eye contact, not anxious appearing, and not depressed appearing.     Impression & Recommendations:  Problem # 1:  HIV DISEASE (ICD-042) Her HIV infection remains under excellent control.  I will not make any changes today Diagnostics Reviewed:  HIV: CDC-defined AIDS (12/23/2008)   CD4: 900 (07/08/2009)   WBC: 7.5 (07/07/2009)   Hgb: 13.7 (07/07/2009)   HCT: 40.9 (07/07/2009)   Platelets: 333 (07/07/2009) HIV-1 RNA: <48 copies/mL (07/07/2009)   HBSAg: No (08/14/2006)  Problem # 2:  PROTEINURIA (ICD-791.0) I will check a urinalysis to screen for proteinuria. Orders: Est. Patient Level IV (16109) T-Urinalysis ([id number]) T-Urinalysis Dipstick only (91478GN)  Medications  Added to Medication List This Visit: 1)  Xyzal 5 Mg Tabs (Levocetirizine dihydrochloride) .... Take 1 tablet by mouth once a day 2)  Omnaris 50 Mcg/act Susp (Ciclesonide)  Other Orders: Future Orders: T-CD4SP ([facility]) (CD4SP) ... 01/17/2010 T-HIV Viral Load [id number]) ... 01/17/2010 T-CBC w/Diff ([id number]) ... 01/17/2010  Patient Instructions: 1)  Please schedule a follow-up appointment in 6 months.  Process Orders Check Orders Results:     [facility]: ABN not required for this insurance Tests Sent for requisitioning (July 21, 2009 11:14 AM):     01/17/2010: [facility] -- T-HIV Viral Load [phone] (signed)     01/17/2010: [facility] -- T-CBC w/Diff [[id number]] (signed)     07/21/2009: [facility] -- T-Urinalysis [[id number]] (signed)    Influenza Immunization History:    Influenza # 1:  Historical (03/20/2009)   Appended Document: resfrom pm to am/kamschedule dhange  Laboratory Results   Urine Tests  Date/Time Recieved: 07/21/2009 @ 1115 Date/Time Reported: 07/21/2009 @1119  Angelique Blonder Estridge RN  July 21, 2009 11:21 AM   Routine Urinalysis   Color: lt. yellow Appearance: Clear Glucose: negative   (Normal Range: Negative) Bilirubin: negative   (Normal Range: Negative) Ketone: negative   (Normal Range: Negative) Spec. Gravity: 1.015   (Normal Range: 1.003-1.035) Blood: trace-intact   (Normal Range: Negative) pH: 5.0   (Normal Range: 5.0-8.0) Protein: negative   (Normal Range: Negative) Urobilinogen: 0.2   (Normal Range: 0-1) Nitrite: negative   (Normal Range: Negative) Leukocyte Esterace: negative   (Normal Range: Negative)

## 2010-08-05 ENCOUNTER — Encounter (INDEPENDENT_AMBULATORY_CARE_PROVIDER_SITE_OTHER): Payer: Self-pay | Admitting: *Deleted

## 2010-08-09 ENCOUNTER — Encounter (INDEPENDENT_AMBULATORY_CARE_PROVIDER_SITE_OTHER): Payer: Self-pay | Admitting: *Deleted

## 2010-08-11 ENCOUNTER — Telehealth (INDEPENDENT_AMBULATORY_CARE_PROVIDER_SITE_OTHER): Payer: Self-pay | Admitting: *Deleted

## 2010-08-11 NOTE — Miscellaneous (Addendum)
  Clinical Lists Changes 

## 2010-08-17 ENCOUNTER — Other Ambulatory Visit: Payer: Self-pay | Admitting: Internal Medicine

## 2010-08-17 ENCOUNTER — Other Ambulatory Visit (INDEPENDENT_AMBULATORY_CARE_PROVIDER_SITE_OTHER): Payer: Managed Care, Other (non HMO)

## 2010-08-17 ENCOUNTER — Encounter: Payer: Self-pay | Admitting: Internal Medicine

## 2010-08-17 DIAGNOSIS — B20 Human immunodeficiency virus [HIV] disease: Secondary | ICD-10-CM

## 2010-08-17 LAB — CONVERTED CEMR LAB
ALT: 11 units/L (ref 0–35)
Albumin: 4 g/dL (ref 3.5–5.2)
CO2: 26 meq/L (ref 19–32)
Calcium: 9.1 mg/dL (ref 8.4–10.5)
Chloride: 104 meq/L (ref 96–112)
Cholesterol: 155 mg/dL (ref 0–200)
Eosinophils Absolute: 0 10*3/uL (ref 0.0–0.7)
Glucose, Bld: 91 mg/dL (ref 70–99)
HDL: 62 mg/dL (ref >39)
HIV-1 RNA Quant, Log: <1.3 (ref <1.30)
Lymphocytes Relative: 38 % (ref 12–46)
Lymphs Abs: 2 10*3/uL (ref 0.7–4.0)
Monocytes Relative: 6 % (ref 3–12)
Neutro Abs: 2.9 10*3/uL (ref 1.7–7.7)
Neutrophils Relative %: 55 % (ref 43–77)
Platelets: 291 10*3/uL (ref 150–400)
RBC: 4.42 M/uL (ref 3.87–5.11)
Sodium: 138 meq/L (ref 135–145)
Total CHOL/HDL Ratio: 2.5
Total Protein: 7.2 g/dL (ref 6.0–8.3)
Triglycerides: 55 mg/dL (ref <150)
VLDL: 11 mg/dL (ref 0–40)
WBC: 5.3 10*3/uL (ref 4.0–10.5)

## 2010-08-17 NOTE — Progress Notes (Signed)
Summary: Pt. needs f/u appt. w/ Dr. Orvan Falconer, lab work and PAP smear  Phone Note Outgoing Call   Call placed by: Jennet Maduro RN,  August 11, 2010 5:04 PM Call placed to: Patient Summary of Call: Message left for pt. to schedule f/u appt. w/ Dr. Orvan Falconer, lab work and PAP smear. Jennet Maduro RN  August 11, 2010 5:04 PM

## 2010-08-17 NOTE — Miscellaneous (Signed)
  Clinical Lists Changes  Observations: Added new observation of PCTFPL: 462.54  (08/09/2010 10:08) Added new observation of HOUSEINCOME: 16109  (08/09/2010 10:08)      NET INCOME - 60454

## 2010-08-18 LAB — T-HELPER CELL (CD4) - (RCID CLINIC ONLY): CD4 % Helper T Cell: 36 % (ref 33–55)

## 2010-08-31 ENCOUNTER — Encounter: Payer: Self-pay | Admitting: Internal Medicine

## 2010-08-31 ENCOUNTER — Ambulatory Visit (INDEPENDENT_AMBULATORY_CARE_PROVIDER_SITE_OTHER): Payer: Managed Care, Other (non HMO) | Admitting: Internal Medicine

## 2010-08-31 DIAGNOSIS — B20 Human immunodeficiency virus [HIV] disease: Secondary | ICD-10-CM

## 2010-09-06 LAB — T-HELPER CELL (CD4) - (RCID CLINIC ONLY)
CD4 % Helper T Cell: 39 % (ref 33–55)
CD4 T Cell Abs: 900 uL (ref 400–2700)

## 2010-09-07 NOTE — Assessment & Plan Note (Signed)
Summary: 64month f/u   Vital Signs:  Patient profile:   40 year old female Menstrual status:  regular LMP:     08/22/2010 Height:      65 inches (165.10 cm) Weight:      159.25 pounds (72.39 kg) BMI:     26.60 Temp:     97.9 degrees F (36.61 degrees C) oral Pulse rate:   81 / minute BP sitting:   130 / 84  (left arm) Cuff size:   regular  Vitals Entered By: Jennet Maduro RN (August 31, 2010 10:00 AM) CC: follow-up visit Is Patient Diabetic? No Pain Assessment Patient in pain? no      Nutritional Status BMI of 25 - 29 = overweight Nutritional Status Detail appetite "OK"  Have you ever been in a relationship where you felt threatened, hurt or afraid?No   Does patient need assistance? Functional Status Self care Ambulation Normal Comments no missed doses LMP (date): 08/22/2010     Enter LMP: 08/22/2010 Last PAP Result Negative fo intraepithelial lesion and malignancy   CC:  follow-up visit.  History of Present Illness: Bailey Morris is in for her routine visit.  She is not changed any of her medications and never misses a single dose.  She says that work is just so swollen that something just beyond her control.  She is still not dating and is not sexually active.  Preventive Screening-Counseling & Management  Alcohol-Tobacco     Alcohol drinks/day: <1     Alcohol type: wine     Smoking Status: never     Passive Smoke Exposure: yes  Caffeine-Diet-Exercise     Caffeine use/day: yes     Does Patient Exercise: no     Type of exercise: P90X, at home, FIT TV     Exercise (avg: min/session): 30-60     Times/week: 4     Exercise Counseling: to improve exercise regimen  Hep-HIV-STD-Contraception     HIV Risk: no risk noted     HIV Risk Counseling: not indicated-no HIV risk noted  Safety-Violence-Falls     Seat Belt Use: yes  Comments: declined condoms      Sexual History:  n/a.        Drug Use:  never.    Current Medications (verified): 1)  Atripla 600-200-300  Mg Tabs (Efavirenz-Emtricitab-Tenofovir) .... Take 1 Tablet By Mouth Once A Day 2)  Xyzal 5 Mg Tabs (Levocetirizine Dihydrochloride) .... Take 1 Tablet By Mouth Once A Day 3)  Singulair 10 Mg Tabs (Montelukast Sodium) .... Take 1 Tablet By Mouth At Bedtime 4)  Omnaris 50 Mcg/act Susp (Ciclesonide)  Allergies: 1)  ! Septra 2)  ! * Advair  Physical Exam  General:  alert and well-nourished.   Mouth:  good dentition and pharynx pink and moist.  no erythema and no exudates.   Lungs:  normal breath sounds.  no crackles and no wheezes.   Heart:  normal rate, regular rhythm, and no murmur.   Psych:  normally interactive, good eye contact, not anxious appearing, and not depressed appearing.          Medication Adherence: 08/31/2010   Adherence to medications reviewed with patient. Counseling to provide adequate adherence provided                                 Impression & Recommendations:  Problem # 1:  HIV DISEASE (ICD-042) Her infection remains under excellent control.  I will continue her current regimen. Diagnostics Reviewed:  HIV: CDC-defined AIDS (12/23/2008)   CD4: 770 (08/18/2010)   WBC: 5.3 (08/17/2010)   Hgb: 13.5 (08/17/2010)   HCT: 39.4 (08/17/2010)   Platelets: 291 (08/17/2010) HIV-1 RNA: <20 copies/mL (08/17/2010)   HBSAg: No (08/14/2006)  Other Orders: Est. Patient Level III (16109) Future Orders: T-CD4SP ([facility]) (CD4SP) ... 02/27/2011 T-HIV Viral Load [phone]) ... 02/27/2011  Patient Instructions: 1)  Please schedule a follow-up appointment in 6 months.   Orders Added: 1)  T-CD4SP ([facility]) [CD4SP] 2)  T-HIV Viral Load [phone] 3)  Est. Patient Level III [13086]   Immunization History:  Influenza Immunization History:    Influenza:  historical (03/08/2010)   Immunization History:  Influenza Immunization History:    Influenza:  Historical (03/08/2010)        Medication Adherence: 08/31/2010   Adherence to medications reviewed  with patient. Counseling to provide adequate adherence provided

## 2010-09-27 LAB — T-HELPER CELL (CD4) - (RCID CLINIC ONLY): CD4 T Cell Abs: 790 uL (ref 400–2700)

## 2010-11-02 NOTE — Op Note (Signed)
Bailey, Morris             ACCOUNT NO.:  [id number]   MEDICAL RECORD NO.:  [id number]          PATIENT TYPE:  INP   LOCATION:  9399                          FACILITY:  [facility]   PHYSICIAN:  Kathreen Cosier, M.D.DATE OF BIRTH:  09-Jul-1970   DATE OF PROCEDURE:  05/23/2007  DATE OF DISCHARGE:                               OPERATIVE REPORT   PREOPERATIVE DIAGNOSES:  Multiple myomas.   POSTOPERATIVE DIAGNOSES:  Multiple myomas and multiple myomectomy.   SURGEON:  Kathreen Cosier, M.D.   FIRST ASSISTANT:  Charles A. Clearance Coots, M.D.   ANESTHESIA:  General.   DESCRIPTION OF PROCEDURE:  Patient was placed on the operating table in  the supine position, general anesthesia administered, abdomen prepped  and draped, bladder emptied with Foley catheter.  Transverse suprapubic  incision made, carried onto rectus fascia, fascia cleaned and incised  the length of the incision.  Rectus muscles were retracted laterally,  peritoneum incised longitudinally.  The uterus was delivered and she had  an approximately 13 cm fundal myoma and, within the body of the uterus,  5 cm myomas plus 3 and 2 cm myomas.  The larger myoma was removed with a  scalpel at the surface of the uterus and then an incision was made  across the top of the uterus through which the remaining myomas were  removed with blunt and sharp dissection.  The cavity was not entered and  the tubes and ovaries were normal.  The uterus was closed with multiple  sutures of interrupted sutures.  Hemostasis was satisfactory and blood  loss 400 mL.  Interceed was placed across the repaired aspect of the  uterus.  Lap and sponge counts correct.  Abdomen closed in layers.  Peritoneum with continuous suture of 0 chromic.  Fascia with continuous  suture of 0 Dexon.  Skin closed with subcuticular stitch of 4-0  Monocryl.   The patient tolerated the procedure well, taken to the recovery room in  good condition.     ______________________________  Kathreen Cosier, M.D.     BAM/MEDQ  D:  05/23/2007  T:  05/23/2007  Job:  [id number]

## 2010-11-02 NOTE — Discharge Summary (Signed)
Bailey Morris, MONDESIR             ACCOUNT NO.:  [id number]   MEDICAL RECORD NO.:  [id number]          PATIENT TYPE:  INP   LOCATION:  9310                          FACILITY:  [facility]   PHYSICIAN:  Kathreen Cosier, M.D.DATE OF BIRTH:  25-Jul-1970   DATE OF ADMISSION:  05/23/2007  DATE OF DISCHARGE:  05/25/2007                               DISCHARGE SUMMARY   The patient said 40 year old gravida 0 with multiple large myomas who  was admitted for multiple myomectomy.  The patient has a history of HIV.  She takes Atripla one p.o. daily.  On admission her hemoglobin was 14.8,  platelet 266, postop 8.6 and 175.  Sodium 136, potassium 3, chloride  103.  The patient underwent a multiple myomectomy and did well postop.  She was treated with ferrous sulfate p.o. daily and Tylox for pain.  She  was discharged on the second postop day ambulatory  and on a regular  diet.  To see me in 2 weeks.   DISCHARGE DIAGNOSIS:  Status post multiple myomectomy.           ______________________________  Kathreen Cosier, M.D.     BAM/MEDQ  D:  06/12/2007  T:  06/13/2007  Job:  [id number]

## 2010-11-05 NOTE — Discharge Summary (Signed)
[facility]  Patient:    Bailey Morris, Bailey Morris                    MRN: [id number] Adm. Date:  [id number] Disc. Date: [id number] Attending:  Levy Sjogren Dictator:   Rennis Petty, M.D.                           Discharge Summary  DISCHARGE DIAGNOSES:  1. Acquired immune deficiency syndrome.  CD-4 count less than 10, March,     2001, with viral load 213,086 diagnosed March, 2001.     a. 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 Truett Perna.     b. Counts on admission:  Hemoglobin 10.7 which then dropped to 8.2 and        then 4.3 on admission.  Reticulocyte haptoglobin was less than 5.     c. Platelets on admission 258.  2. 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     BUN 87, creatinine 5.5 on admission, resolved.  3. Acute hepatitis, April, 2001, ? secondary to Septra reaction.  Hepatitis B     and C negative.  Acid-fast bacilli cultures have been negative.  4. Acute weight loss more than 15 pounds in the last month.  5. Clostridium difficile colitis on Nov 15, 1999.  6. Fevers, ? secondary to thrombotic thrombocytopenic purpura versus line     infection, currently afebrile.  CURRENT MEDICATIONS:  1. 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  2. Protonix 40 mg one tablet p.o. q.d.  3. Diflucan 200 mg one tablet p.o. q.d.  4. Flagyl 500 mg one tablet p.o. t.i.d.  5. Sustiva.  6. Kaletra.  7. EPO 40,000 units q. week.  8. Calcium carbonate as needed.  9. Tylenol as needed. 10. Morphine as needed.  HISTORY OF PRESENT ILLNESS:  Bailey Morris is a 40 year old female recently diagnosed with HIV in March, 2001, recently started on HIV medications on October 05, 1999.  Since then, she has developed gradual weakness, fatigue, exertion shortness of breath.  For the last two days, she has also  had reduced p.o. intake.  Patient also has a history of ? serum sickness secondary to Septra in March, 2001.  Hemoglobin of Oct 26, 1999, is 8.2 which has dropped since then to 4.3.  No history of fever, chills, cough, shortness of breath, abdominal pain, headache, neck stiffness currently.  PHYSICAL EXAMINATION:  VITAL SIGNS:  Patients blood pressure is 85/48, positive orthostatic.  HEENT:  Dry mucous membranes with the presence of oral thrush.  CARDIOVASCULAR:  Exam is tachycardic with systolic murmur of 3/6 at the left sternal border.  NEUROLOGIC:  Intact.  LABORATORY DATA:  Hemoglobin on admission 4.3, white count 0.8, platelets 227. SGOT 258 and 110, BUN and creatinine 5.5.  HOSPITAL COURSE: #1 - ANEMIA:  Patient initially was evaluated for workup of anemia where the haptoglobin was less than 5.  She was Hemoccult negative.  Her hemoglobin rapidly increased after two units of packed RBCs infusion.  It rapidly improved with two units of packed RBCs.  Subsequently, hemoglobin continued to drop.  Therefore, hematology consult was obtained.  Patient, also in this course of time, had leukopenia which slowly resolved.  She was initially started  on EPO for her low hemoglobin at 40,000 units q. week.  She will require this as an outpatient, as well.  Her hemoglobin continued to drop and this also added to the fact that her platelets also began to drop to 47,000. On admission, it was 236,000.  Dr. Truett Perna felt that she required plasmapheresis.  Therefore, plasmapheresis x 3 was done.  Peripheral blood smear was consistent with TTP.  The patients blood counts have improved since then.  Her hemoglobin is currently 11.6 with platelet count of 287, white count 4.7 after plasmapheresis.  The plan will be for her to continue monitoring her labs over the next course of the couple of days and discharge her to home with close follow-up with CBCs twice weekly.  Patient has had a subclavian  catheter which has been placed for the purpose of plasmapheresis. This will be kept in for the next two weeks.  #2 - HSV:  On admission, patient was noted to have a perirectal ulcer which was consistent with HSV.  Cultures are still pending.  Patient was started on treatment with Valtrex 500 mg.  She should continue this for the next seven more days.  She then should be placed on suppressive therapy with 1000 mg a day of Valtrex.  #3 - AIDS:  Patient was started on ______ therapy during this admission.  Her CD-4 count was very low on admission; therefore, she was started on Kaletra and Sustiva.  Patient, after starting Kaletra and Sustiva, tolerated them well.  She also received pentamidine inhalation one time this month.  She will have her next treatment next month.  This is important to continue over the next couple of months.  Otherwise, her AIDs treatment will be managed by Dr. Orvan Falconer.  #4 - FEVER:  Patient throughout this admission has had intermittent fevers. This initially was considered to be secondary to the TTP.  However, we will continue to follow her temperatures.  Currently, this morning, it is 100.6 after being afebrile x 2 days.  She did have a PICC line placed for enteral nutrition which was discontinued.  Currently, she has a subclavian line.  If her temperatures continue to increase, she will need to have that removed and replaced.  #5 - DIARRHEA:  Patient last week had multiple episodes of diarrhea at which time C. difficile was ordered.  One out of three were positive.  Therefore, patient was started on Flagyl 500 mg t.i.d.  We will continue on that medication.  She is currently on day #5 out of 7 on Flagyl.  This will be discontinued by the physicians taking over for me.  #6 - SOCIAL:  Patient wants to return to work at [employer] and finish her [facility] after recovery.  The main part in our role is emotional support which is provided by Britta Mccreedy ______ as  well as Lupita Leash, the case managers on this floor.  FOLLOW-UP:  Follow-up is with Dr. Orvan Falconer as an outpatient and she will need  a follow-up appointment with him immediately after discharge for follow-up of PVCs. DD:  11/18/99 TD:  11/22/99 Job: [id number]

## 2011-03-01 ENCOUNTER — Other Ambulatory Visit (INDEPENDENT_AMBULATORY_CARE_PROVIDER_SITE_OTHER): Payer: Managed Care, Other (non HMO)

## 2011-03-01 DIAGNOSIS — B20 Human immunodeficiency virus [HIV] disease: Secondary | ICD-10-CM

## 2011-03-02 LAB — T-HELPER CELL (CD4) - (RCID CLINIC ONLY)
CD4 % Helper T Cell: 36 % (ref 33–55)
CD4 T Cell Abs: 740 uL (ref 400–2700)

## 2011-03-03 LAB — HIV-1 RNA QUANT-NO REFLEX-BLD: HIV 1 RNA Quant: <20 copies/mL (ref <20)

## 2011-03-15 ENCOUNTER — Ambulatory Visit (INDEPENDENT_AMBULATORY_CARE_PROVIDER_SITE_OTHER): Payer: Managed Care, Other (non HMO) | Admitting: Internal Medicine

## 2011-03-15 ENCOUNTER — Encounter: Payer: Self-pay | Admitting: Internal Medicine

## 2011-03-15 VITALS — BP 124/83 | HR 102 | Temp 98.0°F | Ht 66.0 in | Wt 153.0 lb

## 2011-03-15 DIAGNOSIS — B20 Human immunodeficiency virus [HIV] disease: Secondary | ICD-10-CM

## 2011-03-15 NOTE — Assessment & Plan Note (Signed)
Her viral load remains undetectable at less than 20 and her CD4 count is stable at 740. I will continue her Atripla.

## 2011-03-15 NOTE — Progress Notes (Signed)
  Subjective:    Patient ID: Bailey Morris, female    DOB: 02/02/1971, 40 y.o.   MRN: [id number]  HPI Bailey Morris is in for her routine six-month visit. She has had some minor problems with rashes over the last few months and is seen Dr. Dorita Sciara PA. She states that she's been treated for an allergic reaction to sun exposure and was told that she might have a fungal toenail infection of her left great toenail. She states that she's noticed a stable dark spot under her left great toenail for quite some time now. She has no other problems with that toenail. She denies missing any doses of her Atripla.    Review of Systems     Objective:   Physical Exam  Constitutional: She appears well-nourished. No distress.       Her weight is down about 5 pounds.  HENT:  Mouth/Throat: No oropharyngeal exudate.  Cardiovascular: Normal heart sounds.   Pulmonary/Chest: Breath sounds normal.  Psychiatric: She has a normal mood and affect.          Assessment & Plan:

## 2011-03-17 LAB — T-HELPER CELL (CD4) - (RCID CLINIC ONLY)
CD4 % Helper T Cell: 32 — ABNORMAL LOW
CD4 T Cell Abs: 610

## 2011-03-25 LAB — T-HELPER CELL (CD4) - (RCID CLINIC ONLY): CD4 % Helper T Cell: 34 % (ref 33–55)

## 2011-03-28 LAB — CBC
HCT: 24.9 — ABNORMAL LOW
HCT: 41.5
Hemoglobin: 14.5
Hemoglobin: 8.6 — ABNORMAL LOW
MCHC: 34.6
MCHC: 34.8
MCV: 93.6
Platelets: 266
RBC: 2.65 — ABNORMAL LOW
RBC: 4.43
RDW: 11.9
WBC: 5.3

## 2011-03-28 LAB — COMPREHENSIVE METABOLIC PANEL
BUN: 10
CO2: 28
Calcium: 9.1
Chloride: 103
Creatinine, Ser: 0.89
GFR calc non Af Amer: >60
Total Bilirubin: 0.6

## 2011-04-05 LAB — T-HELPER CELL (CD4) - (RCID CLINIC ONLY): CD4 % Helper T Cell: 32 — ABNORMAL LOW

## 2011-09-21 ENCOUNTER — Other Ambulatory Visit: Payer: Self-pay | Admitting: Internal Medicine

## 2011-09-22 ENCOUNTER — Other Ambulatory Visit: Payer: Self-pay | Admitting: *Deleted

## 2011-09-22 DIAGNOSIS — B20 Human immunodeficiency virus [HIV] disease: Secondary | ICD-10-CM

## 2011-09-22 MED ORDER — EFAVIRENZ-EMTRICITAB-TENOFOVIR 600-200-300 MG PO TABS: 1.0000 | ORAL_TABLET | Freq: Every day | ORAL | Status: DC

## 2011-10-20 ENCOUNTER — Other Ambulatory Visit: Payer: Managed Care, Other (non HMO)

## 2011-10-20 DIAGNOSIS — B20 Human immunodeficiency virus [HIV] disease: Secondary | ICD-10-CM

## 2011-10-20 LAB — COMPREHENSIVE METABOLIC PANEL
ALT: 15 U/L (ref 0–35)
CO2: 25 mEq/L (ref 19–32)
Calcium: 9 mg/dL (ref 8.4–10.5)
Chloride: 106 mEq/L (ref 96–112)
Sodium: 140 mEq/L (ref 135–145)
Total Protein: 7.1 g/dL (ref 6.0–8.3)

## 2011-10-20 LAB — CBC
HCT: 36.2 % (ref 36.0–46.0)
Hemoglobin: 12.1 g/dL (ref 12.0–15.0)
MCHC: 33.4 g/dL (ref 30.0–36.0)
MCV: 88.1 fL (ref 78.0–100.0)
WBC: 5.1 10*3/uL (ref 4.0–10.5)

## 2011-10-20 LAB — LIPID PANEL: Cholesterol: 163 mg/dL (ref 0–200)

## 2011-10-21 LAB — T-HELPER CELL (CD4) - (RCID CLINIC ONLY)
CD4 % Helper T Cell: 38 % (ref 33–55)
CD4 T Cell Abs: 760 uL (ref 400–2700)

## 2011-10-24 LAB — HIV-1 RNA QUANT-NO REFLEX-BLD: HIV-1 RNA Quant, Log: <1.3 {Log} (ref <1.30)

## 2011-11-01 ENCOUNTER — Ambulatory Visit: Payer: Managed Care, Other (non HMO) | Admitting: Internal Medicine

## 2011-11-03 ENCOUNTER — Ambulatory Visit (INDEPENDENT_AMBULATORY_CARE_PROVIDER_SITE_OTHER): Payer: Managed Care, Other (non HMO) | Admitting: Internal Medicine

## 2011-11-03 ENCOUNTER — Encounter: Payer: Self-pay | Admitting: Internal Medicine

## 2011-11-03 VITALS — BP 137/95 | HR 79 | Temp 97.8°F | Ht 65.5 in | Wt 164.8 lb

## 2011-11-03 DIAGNOSIS — B20 Human immunodeficiency virus [HIV] disease: Secondary | ICD-10-CM

## 2011-11-03 NOTE — Progress Notes (Signed)
Patient ID: Bailey Morris, female   DOB: 05-19-1971, 41 y.o.   MRN: [id number]  INFECTIOUS DISEASE PROGRESS NOTE    Subjective: Bailey Morris is in for her first visit since September of last year. She recalls missing only one dose of her Atripla since that visit when she fell asleep before taking it. She has felt a little more sluggish and tired recently. She does note that she has gained weight and she is not exercising nearly as frequently as she was last year.  Objective: Temp: 97.8 F (36.6 C) (05/16 1008) Temp src: Oral (05/16 1008) BP: 137/95 mmHg (05/16 1008) Pulse Rate: 79  (05/16 1008)  General: Her weight is up 9-1/2 pounds to 164 pounds Skin: No rash Lungs: Clear Cor: Regular S1 and S2 and no murmurs  Lab Results HIV 1 RNA Quant (copies/mL)  Date Value  10/20/2011 <20   03/01/2011 <20   08/17/2010 <20 copies/mL      CD4 T Cell Abs (cmm)  Date Value  10/20/2011 760   03/01/2011 740   08/17/2010 770      Assessment: Her HIV remains under excellent control. I will continue Atripla.  Her blood pressure is elevated today. It may be related to her recent relative inactivity and weight gain. I encouraged her to get back to more regular exercise and also suggested that she find a primary care physician to oversee all of her care.  Plan: 1. Continue Atripla 2. Return after lab work in 6 months   Cliffton Asters, MD [facility] for Infectious Diseases [facility] [phone] pager   [phone] cell 11/03/2011, 10:30 AM

## 2012-01-31 ENCOUNTER — Other Ambulatory Visit (HOSPITAL_COMMUNITY): Payer: Self-pay | Admitting: Obstetrics

## 2012-01-31 DIAGNOSIS — Z1231 Encounter for screening mammogram for malignant neoplasm of breast: Secondary | ICD-10-CM

## 2012-02-08 ENCOUNTER — Emergency Department (HOSPITAL_BASED_OUTPATIENT_CLINIC_OR_DEPARTMENT_OTHER)
Admission: EM | Admit: 2012-02-08 | Discharge: 2012-02-08 | Disposition: A | Discharge status: Home / Self Care | Payer: No Typology Code available for payment source | Attending: Emergency Medicine | Admitting: Emergency Medicine

## 2012-02-08 ENCOUNTER — Encounter (HOSPITAL_BASED_OUTPATIENT_CLINIC_OR_DEPARTMENT_OTHER): Payer: Self-pay

## 2012-02-08 DIAGNOSIS — M79604 Pain in right leg: Secondary | ICD-10-CM

## 2012-02-08 DIAGNOSIS — Z21 Asymptomatic human immunodeficiency virus [HIV] infection status: Secondary | ICD-10-CM | POA: Insufficient documentation

## 2012-02-08 DIAGNOSIS — Z888 Allergy status to other drugs, medicaments and biological substances status: Secondary | ICD-10-CM | POA: Insufficient documentation

## 2012-02-08 DIAGNOSIS — M79609 Pain in unspecified limb: Secondary | ICD-10-CM | POA: Insufficient documentation

## 2012-02-08 HISTORY — DX: Asymptomatic human immunodeficiency virus (hiv) infection status: Z21
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
## 2012-02-10 NOTE — ED Provider Notes (Signed)
Medical screening examination/treatment/procedure(s) were performed by non-physician practitioner and as supervising physician I was immediately available for consultation/collaboration.  Akhil Piscopo, MD 02/10/12 0117 

## 2012-02-16 ENCOUNTER — Ambulatory Visit (HOSPITAL_COMMUNITY)
Admission: RE | Admit: 2012-02-16 | Discharge: 2012-02-16 | Disposition: A | Discharge status: Home / Self Care | Payer: Managed Care, Other (non HMO) | Source: Ambulatory Visit | Attending: Obstetrics | Admitting: Obstetrics

## 2012-02-16 DIAGNOSIS — Z1231 Encounter for screening mammogram for malignant neoplasm of breast: Secondary | ICD-10-CM

## 2012-02-22 ENCOUNTER — Other Ambulatory Visit: Payer: Self-pay | Admitting: Obstetrics

## 2012-02-22 DIAGNOSIS — R928 Other abnormal and inconclusive findings on diagnostic imaging of breast: Secondary | ICD-10-CM

## 2012-02-24 ENCOUNTER — Ambulatory Visit
Admission: RE | Admit: 2012-02-24 | Discharge: 2012-02-24 | Disposition: A | Discharge status: Home / Self Care | Payer: Managed Care, Other (non HMO) | Source: Ambulatory Visit | Attending: Obstetrics | Admitting: Obstetrics

## 2012-02-24 DIAGNOSIS — R928 Other abnormal and inconclusive findings on diagnostic imaging of breast: Secondary | ICD-10-CM

## 2012-03-15 ENCOUNTER — Encounter: Payer: Self-pay | Admitting: Maternal and Fetal Medicine

## 2012-03-31 ENCOUNTER — Other Ambulatory Visit: Payer: Self-pay | Admitting: Internal Medicine

## 2012-04-03 ENCOUNTER — Encounter: Payer: Self-pay | Admitting: Internal Medicine

## 2012-04-03 ENCOUNTER — Telehealth: Payer: Self-pay | Admitting: Licensed Clinical Social Worker

## 2012-04-03 ENCOUNTER — Ambulatory Visit (INDEPENDENT_AMBULATORY_CARE_PROVIDER_SITE_OTHER): Payer: Managed Care, Other (non HMO) | Admitting: Internal Medicine

## 2012-04-03 VITALS — BP 146/99 | HR 85 | Temp 98.6°F | Ht 66.0 in | Wt 168.8 lb

## 2012-04-03 DIAGNOSIS — B20 Human immunodeficiency virus [HIV] disease: Secondary | ICD-10-CM

## 2012-04-03 NOTE — Progress Notes (Addendum)
Patient ID: Bailey Morris, female   DOB: 1970/09/05, 41 y.o.   MRN: [id number]

## 2012-04-03 NOTE — Telephone Encounter (Signed)
[facility] pharmacy called stating that the patient has filled the prescription Clomid and she takes Atripla. I called the patient to ask if she was trying to get pregnant and she said she was and was never told by her specialists that she couldn't take Atripla. She has an appointment today.

## 2012-04-03 NOTE — Progress Notes (Addendum)
Patient ID: Bailey Morris, female   DOB: 1971/02/13, 41 y.o.   MRN: [id number]     [facility] for Infectious Disease  Patient Active Problem List  Diagnosis  . HIV DISEASE  . VENEREAL WART  . MYOMA  . DEPRESSION, MILD  . ASTHMA  . MENORRHAGIA  . SCOLIOSIS  . PROTEINURIA  . SHINGLES, HX OF    Patient's Medications  New Prescriptions   No medications on file  Previous Medications   BECLOMETHASONE (QVAR) 40 MCG/ACT INHALER    Inhale 2 puffs into the lungs 2 (two) times daily.   CLOMIPHENE (CLOMID) 50 MG TABLET    Take 50 mg by mouth daily.   LEVOCETIRIZINE (XYZAL) 5 MG TABLET    Take 5 mg by mouth daily.    MONTELUKAST (SINGULAIR) 10 MG TABLET    Take 10 mg by mouth at bedtime.   Modified Medications   No medications on file  Discontinued Medications   ATRIPLA 600-200-300 MG PER TABLET    TAKE 1 TABLET DAILY   EFAVIRENZ-EMTRICITABINE-TENOFOVIR (ATRIPLA) 600-200-300 MG PER TABLET    Take 1 tablet by mouth daily. HIV medication.  Consult needed.    Subjective:  Bailey Morris is in for a work in visit.her pharmacy called earlier today to obtain refills on her Atripla and at that time we learned that she had recently started taking Clomid. She had not informed me that she was considering getting pregnant at her last visit.she states that she make that decision about 2 months ago and has seen 2 different specialist for fertility testing and treatment. She started on Clomid 6 days ago completing her 5 day course yesterday. She planned on artificial insemination next week before learning about the potential teratogenicity of Atripla. She has not missed any doses of Atripla as usual. She is feeling well.  Objective: Temp: 98.6 F (37 C) (10/15 1436) Temp src: Oral (10/15 1436) BP: 146/99 mmHg (10/15 1436) Pulse Rate: 85  (10/15 1436)  General: she is a little anxious aboutthe need to switch antiretroviral therapies Skin: no rash Lungs: clear Cor: regular S1 and S2 no murmurs  Lab  Results HIV 1 RNA Quant (copies/mL)  Date Value  10/20/2011 <20   03/01/2011 <20   08/17/2010 <20 copies/mL      CD4 T Cell Abs (cmm)  Date Value  10/20/2011 760   03/01/2011 740   08/17/2010 770      Assessment: There is a small risk of neural tube defects that have been related to efavarenz, a component of Atripla. I discussed alternative regimens with her and will use Truvada, Prezista and Norvir dosed once daily. She has already been on the Internet researching this topic and states that she saw something that recommended being off of Atripla for 12 weeks before trying to become pregnant. I think this is probably not an evidence-based recommendation. I called the [facility] perinatal consultation hotlined service. The half life of efavarenz would lead to elimination from the body within about one week. They stated they would recommend being off of her Atripla for one to 2 weeks before attempting to get pregnant.  Plan: 1. Change Atripla to Truvada, Prezista and Norvir (will need to consider dose increase of Prezista in 3rd trimester) 2. Return to clinic after lab work in 1 month   Bailey Asters, MD [facility] for Infectious Disease [facility] [phone] pager   [phone] cell 04/03/2012, 4:25 PM   cc: Dr. Marcelino Scot  Dr. Earvin Hansen  [place]

## 2012-04-03 NOTE — Progress Notes (Signed)
Patient ID: Bailey Morris, female   DOB: 04/18/1971, 41 y.o.   MRN: [id number]  

## 2012-04-05 ENCOUNTER — Other Ambulatory Visit: Payer: Self-pay | Admitting: *Deleted

## 2012-04-05 DIAGNOSIS — B20 Human immunodeficiency virus [HIV] disease: Secondary | ICD-10-CM

## 2012-04-05 MED ORDER — DARUNAVIR ETHANOLATE 800 MG PO TABS: 800.0000 mg | ORAL_TABLET | Freq: Every day | ORAL | Status: DC

## 2012-04-05 MED ORDER — RITONAVIR 100 MG PO TABS: 100.0000 mg | ORAL_TABLET | Freq: Every day | ORAL | Status: DC

## 2012-04-05 MED ORDER — EMTRICITABINE-TENOFOVIR DF 200-300 MG PO TABS: 1.0000 | ORAL_TABLET | Freq: Every day | ORAL | Status: DC

## 2012-04-24 ENCOUNTER — Other Ambulatory Visit: Payer: Managed Care, Other (non HMO)

## 2012-04-24 DIAGNOSIS — B20 Human immunodeficiency virus [HIV] disease: Secondary | ICD-10-CM

## 2012-04-25 LAB — HIV-1 RNA QUANT-NO REFLEX-BLD
HIV 1 RNA Quant: <20 copies/mL (ref <20)
HIV-1 RNA Quant, Log: <1.3 {Log} (ref <1.30)

## 2012-04-25 LAB — T-HELPER CELL (CD4) - (RCID CLINIC ONLY): CD4 T Cell Abs: 870 uL (ref 400–2700)

## 2012-05-08 ENCOUNTER — Ambulatory Visit: Payer: Managed Care, Other (non HMO) | Admitting: Internal Medicine

## 2012-05-09 ENCOUNTER — Ambulatory Visit (INDEPENDENT_AMBULATORY_CARE_PROVIDER_SITE_OTHER): Payer: Managed Care, Other (non HMO) | Admitting: Internal Medicine

## 2012-05-09 ENCOUNTER — Encounter: Payer: Self-pay | Admitting: Internal Medicine

## 2012-05-09 VITALS — BP 118/75 | HR 85 | Temp 97.9°F | Ht 65.5 in | Wt 168.5 lb

## 2012-05-09 DIAGNOSIS — B20 Human immunodeficiency virus [HIV] disease: Secondary | ICD-10-CM

## 2012-05-09 NOTE — Progress Notes (Signed)
Patient ID: Bailey Morris, female   DOB: [birth year], 41 y.o.   MRN: [id number]     [facility] for Infectious Disease  Patient Active Problem List  Diagnosis  . HIV DISEASE  . VENEREAL WART  . MYOMA  . DEPRESSION, MILD  . ASTHMA  . MENORRHAGIA  . SCOLIOSIS  . PROTEINURIA  . SHINGLES, HX OF    Patient's Medications  New Prescriptions   No medications on file  Previous Medications   BECLOMETHASONE (QVAR) 40 MCG/ACT INHALER    Inhale 2 puffs into the lungs 2 (two) times daily.   CLOMIPHENE (CLOMID) 50 MG TABLET    Take 50 mg by mouth daily.   DARUNAVIR ETHANOLATE (PREZISTA) 800 MG TABLET    Take 1 tablet (800 mg total) by mouth daily.   EMTRICITABINE-TENOFOVIR (TRUVADA) 200-300 MG PER TABLET    Take 1 tablet by mouth daily.   LEVOCETIRIZINE (XYZAL) 5 MG TABLET    Take 5 mg by mouth daily.    MONTELUKAST (SINGULAIR) 10 MG TABLET    Take 10 mg by mouth at bedtime.    RITONAVIR (NORVIR) 100 MG TABS    Take 1 tablet (100 mg total) by mouth daily.  Modified Medications   No medications on file  Discontinued Medications   No medications on file    Subjective: Bailey Morris is in for her routine visit today. She has had no problems tolerating her new regimen of Truvada, Prezista or Norvir other than the added pill burden and cost compared with Atripla. She remains on Clomid and underwent artificial insemination yesterday. She had been off of Atripla for about 5 weeks.  Objective: Temp: 97.9 F (36.6 C) (11/20 1606) Temp src: Oral (11/20 1606) BP: 118/75 mmHg (11/20 1606) Pulse Rate: 85  (11/20 1606)  General: She is in good spirits  Lab Results HIV 1 RNA Quant (copies/mL)  Date Value  04/24/2012 <20   10/20/2011 <20   03/01/2011 <20      CD4 T Cell Abs (cmm)  Date Value  04/24/2012 870   10/20/2011 760   03/01/2011 740      Assessment: She is tolerating her new regimen. I will continue it for now and plan on repeat lab work in 3 months. I asked her to call me if she has any  problems or concerns between now and that visit or when she confirms that she is pregnant.  Plan: 1. Continue current antiretroviral regimen 2. Followup after lab work in 3 months   Cliffton Asters, MD [facility] for Infectious Disease [facility] [phone] pager   [phone] cell 05/09/2012, 4:29 PM

## 2012-05-15 ENCOUNTER — Ambulatory Visit: Payer: Managed Care, Other (non HMO) | Admitting: Internal Medicine

## 2012-05-30 ENCOUNTER — Other Ambulatory Visit (HOSPITAL_COMMUNITY): Payer: Self-pay | Admitting: Obstetrics

## 2012-05-30 DIAGNOSIS — D259 Leiomyoma of uterus, unspecified: Secondary | ICD-10-CM

## 2012-06-04 ENCOUNTER — Ambulatory Visit (HOSPITAL_COMMUNITY)
Admission: RE | Admit: 2012-06-04 | Discharge: 2012-06-04 | Disposition: A | Discharge status: Home / Self Care | Payer: Managed Care, Other (non HMO) | Source: Ambulatory Visit | Attending: Obstetrics | Admitting: Obstetrics

## 2012-06-04 DIAGNOSIS — D259 Leiomyoma of uterus, unspecified: Secondary | ICD-10-CM

## 2012-06-04 DIAGNOSIS — D251 Intramural leiomyoma of uterus: Secondary | ICD-10-CM | POA: Insufficient documentation

## 2012-06-04 DIAGNOSIS — D252 Subserosal leiomyoma of uterus: Secondary | ICD-10-CM | POA: Insufficient documentation

## 2012-06-14 ENCOUNTER — Other Ambulatory Visit: Payer: Self-pay | Admitting: Obstetrics

## 2012-06-18 ENCOUNTER — Encounter (HOSPITAL_COMMUNITY): Payer: Self-pay | Admitting: Pharmacist

## 2012-06-21 NOTE — H&P (Signed)
NAME:  Bailey Morris, Bailey Morris NO.:  [id number]  MEDICAL RECORD NO.:  [id number]  LOCATION:                                 FACILITY:  PHYSICIAN:  Kathreen Cosier, M.D.DATE OF BIRTH:  [birth year]  DATE OF ADMISSION: DATE OF DISCHARGE:                             HISTORY & PHYSICAL   HISTORY:  The patient is a 42 year old nulligravida with a history of myoma uteri, who desires to be pregnant.  She went for IVF a month ago and it failed because she has a polyp and multiple myomas and infertility suggested myomectomy, D and C.  PAST SURGICAL HISTORY:  In 2008, she had a multiple myomectomy.  PAST MEDICAL HISTORY:  The patient is HIV positive for many years, and her titer is 0.  She takes Norvir 100 mg daily.  She takes Prezista 800 mg daily.  She takes Truvada 200-300 mg every other day.  ALLERGIES:  SULFA.  SYSTEM REVIEW:  Basically noncontributory.  PHYSICAL EXAMINATION:  GENERAL:  A well-developed female, in no distress. HEENT:  Negative. LUNGS:  Clear to P and A. BREAST:  Negative. HEART:  Regular rhythm.  No murmurs, no gallops. ABDOMEN:  Negative except for a mass arising from the lower pelvis, her myomas confirmed by ultrasound.  Pap smear was negative. EXTREMITIES:  Negative.          ______________________________ Kathreen Cosier, M.D.     BAM/MEDQ  D:  06/21/2012  T:  06/21/2012  Job:  [id number]

## 2012-06-22 ENCOUNTER — Encounter (HOSPITAL_COMMUNITY): Payer: Self-pay

## 2012-06-22 ENCOUNTER — Encounter (HOSPITAL_COMMUNITY)
Admission: RE | Admit: 2012-06-22 | Discharge: 2012-06-22 | Disposition: A | Discharge status: Home / Self Care | Payer: BC Managed Care – PPO | Source: Ambulatory Visit | Attending: Obstetrics | Admitting: Obstetrics

## 2012-06-22 HISTORY — DX: Nausea with vomiting, unspecified: R11.2

## 2012-06-22 HISTORY — DX: Unspecified asthma, uncomplicated: J45.909

## 2012-06-22 HISTORY — DX: Other specified postprocedural states: Z98.890

## 2012-06-22 LAB — COMPREHENSIVE METABOLIC PANEL
ALT: 16 U/L (ref 0–35)
AST: 20 U/L (ref 0–37)
Albumin: 3.3 g/dL — ABNORMAL LOW (ref 3.5–5.2)
Calcium: 8.8 mg/dL (ref 8.4–10.5)
GFR calc Af Amer: 89 mL/min — ABNORMAL LOW (ref >90)
Potassium: 4 mEq/L (ref 3.5–5.1)
Sodium: 137 mEq/L (ref 135–145)
Total Protein: 6.9 g/dL (ref 6.0–8.3)

## 2012-06-22 LAB — DIFFERENTIAL
Eosinophils Relative: 3 % (ref 0–5)
Lymphocytes Relative: 36 % (ref 12–46)
Lymphs Abs: 1.9 10*3/uL (ref 0.7–4.0)
Monocytes Absolute: 0.3 10*3/uL (ref 0.1–1.0)
Monocytes Relative: 6 % (ref 3–12)

## 2012-06-22 LAB — CBC
MCH: 30.4 pg (ref 26.0–34.0)
MCHC: 33.3 g/dL (ref 30.0–36.0)
Platelets: 235 10*3/uL (ref 150–400)
RDW: 13.1 % (ref 11.5–15.5)

## 2012-06-22 NOTE — Patient Instructions (Addendum)
20 [address] TERRERO  06/22/2012   Your procedure is scheduled on:  06/27/12  Enter through the Main Entrance of [facility] at 7 AM.  Pick up the phone at the desk and dial [phone].   Call this number if you have problems the morning of surgery: [phone]   Remember:   Do not eat food:After Midnight.  Do not drink clear liquids: After Midnight.  Take these medicines the morning of surgery with A SIP OF WATER: NA   Do not wear jewelry, make-up or nail polish.  Do not wear lotions, powders, or perfumes. You may wear deodorant.  Do not shave 48 hours prior to surgery.  Do not bring valuables to the hospital.  Contacts, dentures or bridgework may not be worn into surgery.  Leave suitcase in the car. After surgery it may be brought to your room.  For patients admitted to the hospital, checkout time is 11:00 AM the day of discharge.   Patients discharged the day of surgery will not be allowed to drive home.  Name and phone number of your driver: NA  Special Instructions: Shower using CHG 2 nights before surgery and the night before surgery.  If you shower the day of surgery use CHG.  Use special wash - you have one bottle of CHG for all showers.  You should use approximately 1/3 of the bottle for each shower.   Please read over the following fact sheets that you were given: Surgical Site Infection Prevention

## 2012-06-27 ENCOUNTER — Encounter (HOSPITAL_COMMUNITY): Payer: Self-pay

## 2012-06-27 ENCOUNTER — Inpatient Hospital Stay (HOSPITAL_COMMUNITY): Payer: BC Managed Care – PPO | Admitting: Anesthesiology

## 2012-06-27 ENCOUNTER — Inpatient Hospital Stay (HOSPITAL_COMMUNITY)
Admission: RE | Admit: 2012-06-27 | Discharge: 2012-06-30 | DRG: 359 | Disposition: A | Discharge status: Home / Self Care | Payer: BC Managed Care – PPO | Source: Ambulatory Visit | Attending: Obstetrics | Admitting: Obstetrics

## 2012-06-27 ENCOUNTER — Encounter (HOSPITAL_COMMUNITY): Payer: Self-pay | Admitting: Anesthesiology

## 2012-06-27 ENCOUNTER — Encounter (HOSPITAL_COMMUNITY)
Admission: RE | Disposition: A | Discharge status: Home / Self Care | Payer: Self-pay | Source: Ambulatory Visit | Attending: Obstetrics

## 2012-06-27 DIAGNOSIS — Z21 Asymptomatic human immunodeficiency virus [HIV] infection status: Secondary | ICD-10-CM | POA: Diagnosis present

## 2012-06-27 DIAGNOSIS — N840 Polyp of corpus uteri: Secondary | ICD-10-CM | POA: Diagnosis present

## 2012-06-27 DIAGNOSIS — D259 Leiomyoma of uterus, unspecified: Secondary | ICD-10-CM

## 2012-06-27 DIAGNOSIS — D251 Intramural leiomyoma of uterus: Principal | ICD-10-CM | POA: Diagnosis present

## 2012-06-27 DIAGNOSIS — B20 Human immunodeficiency virus [HIV] disease: Secondary | ICD-10-CM

## 2012-06-27 DIAGNOSIS — N979 Female infertility, unspecified: Secondary | ICD-10-CM | POA: Diagnosis present

## 2012-06-27 HISTORY — PX: MYOMECTOMY: SHX85

## 2012-06-27 LAB — URINALYSIS, ROUTINE W REFLEX MICROSCOPIC
Bilirubin Urine: NEGATIVE
Ketones, ur: NEGATIVE mg/dL
Leukocytes, UA: NEGATIVE
Nitrite: NEGATIVE
Protein, ur: NEGATIVE mg/dL
Urobilinogen, UA: 0.2 mg/dL (ref 0.0–1.0)

## 2012-06-27 LAB — COMPREHENSIVE METABOLIC PANEL
AST: 19 U/L (ref 0–37)
BUN: 14 mg/dL (ref 6–23)
CO2: 28 mEq/L (ref 19–32)
Calcium: 8.3 mg/dL — ABNORMAL LOW (ref 8.4–10.5)
Creatinine, Ser: 0.9 mg/dL (ref 0.50–1.10)
GFR calc Af Amer: >90 mL/min (ref >90)
GFR calc non Af Amer: 79 mL/min — ABNORMAL LOW (ref >90)
Glucose, Bld: 158 mg/dL — ABNORMAL HIGH (ref 70–99)
Total Bilirubin: 0.2 mg/dL — ABNORMAL LOW (ref 0.3–1.2)

## 2012-06-27 LAB — PREGNANCY, URINE: Preg Test, Ur: NEGATIVE

## 2012-06-27 LAB — TYPE AND SCREEN

## 2012-06-27 LAB — HCG, QUANTITATIVE, PREGNANCY: hCG, Beta Chain, Quant, S: <1 m[IU]/mL (ref <5)

## 2012-06-27 LAB — ABO/RH: ABO/RH(D): O POS

## 2012-06-27 SURGERY — MYOMECTOMY, ABDOMINAL APPROACH
Anesthesia: General | Site: Abdomen

## 2012-06-27 MED ORDER — MONTELUKAST SODIUM 10 MG PO TABS
10.0000 mg | ORAL_TABLET | Freq: Every day | ORAL | Status: DC
  Administered 2012-06-27 – 2012-06-29 (×3): 10 mg via ORAL
  Filled 2012-06-27 (×4): qty 1

## 2012-06-27 MED ORDER — FENTANYL CITRATE 0.05 MG/ML IJ SOLN
INTRAMUSCULAR | Status: AC
  Filled 2012-06-27: qty 5

## 2012-06-27 MED ORDER — LIDOCAINE HCL (CARDIAC) 20 MG/ML IV SOLN
INTRAVENOUS | Status: DC | PRN
  Administered 2012-06-27: 50 mg via INTRAVENOUS

## 2012-06-27 MED ORDER — LIDOCAINE HCL (CARDIAC) 20 MG/ML IV SOLN
INTRAVENOUS | Status: AC
  Filled 2012-06-27: qty 5

## 2012-06-27 MED ORDER — MEPERIDINE HCL 25 MG/ML IJ SOLN
6.2500 mg | INTRAMUSCULAR | Status: DC | PRN
  Administered 2012-06-27: 12.5 mg via INTRAVENOUS

## 2012-06-27 MED ORDER — HYDROMORPHONE HCL PF 1 MG/ML IJ SOLN
INTRAMUSCULAR | Status: AC
  Administered 2012-06-27: 0.5 mg via INTRAVENOUS
  Filled 2012-06-27: qty 1

## 2012-06-27 MED ORDER — DEXAMETHASONE SODIUM PHOSPHATE 10 MG/ML IJ SOLN
INTRAMUSCULAR | Status: AC
  Filled 2012-06-27: qty 1

## 2012-06-27 MED ORDER — EMTRICITABINE-TENOFOVIR DF 200-300 MG PO TABS
1.0000 | ORAL_TABLET | Freq: Every day | ORAL | Status: DC
  Filled 2012-06-27: qty 1

## 2012-06-27 MED ORDER — SCOPOLAMINE 1 MG/3DAYS TD PT72
MEDICATED_PATCH | TRANSDERMAL | Status: AC
  Administered 2012-06-27: 1.5 mg via TRANSDERMAL
  Filled 2012-06-27: qty 1

## 2012-06-27 MED ORDER — HYDROMORPHONE HCL PF 1 MG/ML IJ SOLN
0.2500 mg | INTRAMUSCULAR | Status: DC | PRN
  Administered 2012-06-27 (×2): 0.5 mg via INTRAVENOUS

## 2012-06-27 MED ORDER — PRENATAL MULTIVITAMIN CH
1.0000 | ORAL_TABLET | Freq: Every day | ORAL | Status: DC
  Administered 2012-06-28 – 2012-06-30 (×3): 1 via ORAL
  Filled 2012-06-27 (×3): qty 1

## 2012-06-27 MED ORDER — OXYCODONE-ACETAMINOPHEN 5-325 MG PO TABS
1.0000 | ORAL_TABLET | ORAL | Status: DC | PRN
  Administered 2012-06-28 (×4): 2 via ORAL
  Administered 2012-06-29: 1 via ORAL
  Filled 2012-06-27: qty 1
  Filled 2012-06-27 (×2): qty 2
  Filled 2012-06-27: qty 1
  Filled 2012-06-27 (×2): qty 2

## 2012-06-27 MED ORDER — GLYCOPYRROLATE 0.2 MG/ML IJ SOLN
INTRAMUSCULAR | Status: DC | PRN
  Administered 2012-06-27: 0.6 mg via INTRAVENOUS

## 2012-06-27 MED ORDER — ONDANSETRON HCL 4 MG/2ML IJ SOLN
INTRAMUSCULAR | Status: DC | PRN
  Administered 2012-06-27: 4 mg via INTRAVENOUS

## 2012-06-27 MED ORDER — MEPERIDINE HCL 25 MG/ML IJ SOLN
INTRAMUSCULAR | Status: AC
  Administered 2012-06-27: 12.5 mg via INTRAVENOUS
  Filled 2012-06-27: qty 1

## 2012-06-27 MED ORDER — LORATADINE 10 MG PO TABS
10.0000 mg | ORAL_TABLET | Freq: Every day | ORAL | Status: DC
  Administered 2012-06-27 – 2012-06-29 (×3): 10 mg via ORAL
  Filled 2012-06-27 (×4): qty 1

## 2012-06-27 MED ORDER — DARUNAVIR ETHANOLATE 800 MG PO TABS
800.0000 mg | ORAL_TABLET | Freq: Every day | ORAL | Status: DC
  Filled 2012-06-27: qty 1

## 2012-06-27 MED ORDER — PROPOFOL 10 MG/ML IV EMUL
INTRAVENOUS | Status: AC
  Filled 2012-06-27: qty 20

## 2012-06-27 MED ORDER — NEOSTIGMINE METHYLSULFATE 1 MG/ML IJ SOLN
INTRAMUSCULAR | Status: AC
  Filled 2012-06-27: qty 1

## 2012-06-27 MED ORDER — SCOPOLAMINE 1 MG/3DAYS TD PT72
1.0000 | MEDICATED_PATCH | TRANSDERMAL | Status: DC
  Administered 2012-06-27: 1.5 mg via TRANSDERMAL

## 2012-06-27 MED ORDER — SIMETHICONE 80 MG PO CHEW: 80.0000 mg | CHEWABLE_TABLET | Freq: Four times a day (QID) | ORAL | Status: DC | PRN

## 2012-06-27 MED ORDER — CEFAZOLIN SODIUM-DEXTROSE 2-3 GM-% IV SOLR
2.0000 g | Freq: Once | INTRAVENOUS | Status: AC
  Administered 2012-06-27: 2 g via INTRAVENOUS

## 2012-06-27 MED ORDER — LACTATED RINGERS IV SOLN
INTRAVENOUS | Status: DC
  Administered 2012-06-27 (×6): via INTRAVENOUS

## 2012-06-27 MED ORDER — RITONAVIR 100 MG PO TABS
100.0000 mg | ORAL_TABLET | Freq: Every day | ORAL | Status: DC
  Administered 2012-06-27 – 2012-06-29 (×3): 100 mg via ORAL
  Filled 2012-06-27 (×4): qty 1

## 2012-06-27 MED ORDER — ROCURONIUM BROMIDE 50 MG/5ML IV SOLN
INTRAVENOUS | Status: AC
  Filled 2012-06-27: qty 1

## 2012-06-27 MED ORDER — GLYCOPYRROLATE 0.2 MG/ML IJ SOLN
INTRAMUSCULAR | Status: AC
  Filled 2012-06-27: qty 2

## 2012-06-27 MED ORDER — LEVOCETIRIZINE DIHYDROCHLORIDE 5 MG PO TABS: 5.0000 mg | ORAL_TABLET | Freq: Every day | ORAL | Status: DC

## 2012-06-27 MED ORDER — LACTATED RINGERS IV SOLN
INTRAVENOUS | Status: DC
  Administered 2012-06-27: 20:00:00 via INTRAVENOUS

## 2012-06-27 MED ORDER — ACETAMINOPHEN 325 MG PO TABS: 650.0000 mg | ORAL_TABLET | ORAL | Status: DC | PRN

## 2012-06-27 MED ORDER — ALUM & MAG HYDROXIDE-SIMETH 200-200-20 MG/5ML PO SUSP
30.0000 mL | ORAL | Status: DC | PRN
  Administered 2012-06-30: 30 mL via ORAL
  Filled 2012-06-27: qty 30

## 2012-06-27 MED ORDER — PROPOFOL 10 MG/ML IV EMUL
INTRAVENOUS | Status: DC | PRN
  Administered 2012-06-27: 150 mg via INTRAVENOUS

## 2012-06-27 MED ORDER — DEXAMETHASONE SODIUM PHOSPHATE 4 MG/ML IJ SOLN
INTRAMUSCULAR | Status: DC | PRN
  Administered 2012-06-27: 10 mg via INTRAVENOUS

## 2012-06-27 MED ORDER — MIDAZOLAM HCL 5 MG/5ML IJ SOLN
INTRAMUSCULAR | Status: DC | PRN
  Administered 2012-06-27: 2 mg via INTRAVENOUS

## 2012-06-27 MED ORDER — LIDOCAINE HCL 0.5 % IJ SOLN
INTRAMUSCULAR | Status: AC
  Filled 2012-06-27: qty 1

## 2012-06-27 MED ORDER — GLYCOPYRROLATE 0.2 MG/ML IJ SOLN
INTRAMUSCULAR | Status: AC
  Filled 2012-06-27: qty 1

## 2012-06-27 MED ORDER — EMTRICITABINE-TENOFOVIR DF 200-300 MG PO TABS
1.0000 | ORAL_TABLET | Freq: Every day | ORAL | Status: DC
  Administered 2012-06-27 – 2012-06-29 (×3): 1 via ORAL
  Filled 2012-06-27 (×4): qty 1

## 2012-06-27 MED ORDER — LORATADINE 10 MG PO TABS
10.0000 mg | ORAL_TABLET | Freq: Every day | ORAL | Status: DC
  Filled 2012-06-27: qty 1

## 2012-06-27 MED ORDER — KETOROLAC TROMETHAMINE 30 MG/ML IJ SOLN: 15.0000 mg | Freq: Once | INTRAMUSCULAR | Status: DC | PRN

## 2012-06-27 MED ORDER — PROMETHAZINE HCL 25 MG/ML IJ SOLN: 6.2500 mg | INTRAMUSCULAR | Status: DC | PRN

## 2012-06-27 MED ORDER — METOCLOPRAMIDE HCL 5 MG/ML IJ SOLN: 10.0000 mg | Freq: Four times a day (QID) | INTRAMUSCULAR | Status: DC | PRN

## 2012-06-27 MED ORDER — RITONAVIR 100 MG PO TABS
100.0000 mg | ORAL_TABLET | Freq: Every day | ORAL | Status: DC
  Filled 2012-06-27: qty 1

## 2012-06-27 MED ORDER — HYDROMORPHONE HCL PF 1 MG/ML IJ SOLN
0.2000 mg | INTRAMUSCULAR | Status: DC | PRN
  Administered 2012-06-27 (×2): 0.6 mg via INTRAVENOUS
  Administered 2012-06-27: 0.4 mg via INTRAVENOUS
  Administered 2012-06-27 – 2012-06-28 (×2): 0.6 mg via INTRAVENOUS
  Filled 2012-06-27 (×4): qty 1

## 2012-06-27 MED ORDER — MIDAZOLAM HCL 2 MG/2ML IJ SOLN
INTRAMUSCULAR | Status: AC
  Filled 2012-06-27: qty 2

## 2012-06-27 MED ORDER — ONDANSETRON HCL 4 MG/2ML IJ SOLN
INTRAMUSCULAR | Status: AC
  Filled 2012-06-27: qty 2

## 2012-06-27 MED ORDER — FENTANYL CITRATE 0.05 MG/ML IJ SOLN
INTRAMUSCULAR | Status: DC | PRN
  Administered 2012-06-27 (×6): 50 ug via INTRAVENOUS
  Administered 2012-06-27: 100 ug via INTRAVENOUS
  Administered 2012-06-27 (×2): 50 ug via INTRAVENOUS

## 2012-06-27 MED ORDER — ROCURONIUM BROMIDE 100 MG/10ML IV SOLN
INTRAVENOUS | Status: DC | PRN
  Administered 2012-06-27: 35 mg via INTRAVENOUS
  Administered 2012-06-27 (×2): 5 mg via INTRAVENOUS

## 2012-06-27 MED ORDER — DARUNAVIR ETHANOLATE 800 MG PO TABS
800.0000 mg | ORAL_TABLET | Freq: Every day | ORAL | Status: DC
  Administered 2012-06-27 – 2012-06-29 (×3): 800 mg via ORAL
  Filled 2012-06-27 (×4): qty 1

## 2012-06-27 MED ORDER — NEOSTIGMINE METHYLSULFATE 1 MG/ML IJ SOLN
INTRAMUSCULAR | Status: DC | PRN
  Administered 2012-06-27: 3 mg via INTRAVENOUS

## 2012-06-27 MED ORDER — CEFAZOLIN SODIUM-DEXTROSE 2-3 GM-% IV SOLR
INTRAVENOUS | Status: AC
  Filled 2012-06-27: qty 50

## 2012-06-27 MED ORDER — FLUTICASONE PROPIONATE HFA 44 MCG/ACT IN AERO: 1.0000 | INHALATION_SPRAY | Freq: Two times a day (BID) | RESPIRATORY_TRACT | Status: DC | PRN

## 2012-06-27 SURGICAL SUPPLY — 44 items
ADH SKN CLS APL DERMABOND .7 (GAUZE/BANDAGES/DRESSINGS) ×2
BARRIER ADHS 3X4 INTERCEED (GAUZE/BANDAGES/DRESSINGS) ×2 IMPLANT
BRR ADH 4X3 ABS CNTRL BYND (GAUZE/BANDAGES/DRESSINGS) ×4
CANISTER SUCTION 2500CC (MISCELLANEOUS) ×3 IMPLANT
CATH ROBINSON RED A/P 16FR (CATHETERS) ×2 IMPLANT
CHLORAPREP W/TINT 26ML (MISCELLANEOUS) ×3 IMPLANT
CLOTH BEACON ORANGE TIMEOUT ST (SAFETY) ×3 IMPLANT
CONT PATH 16OZ SNAP LID 3702 (MISCELLANEOUS) ×3 IMPLANT
CONTAINER PREFILL 10% NBF 60ML (FORM) ×4 IMPLANT
DECANTER SPIKE VIAL GLASS SM (MISCELLANEOUS) ×3 IMPLANT
DERMABOND ADVANCED (GAUZE/BANDAGES/DRESSINGS) ×1
DERMABOND ADVANCED .7 DNX12 (GAUZE/BANDAGES/DRESSINGS) IMPLANT
DRESSING TELFA 8X3 (GAUZE/BANDAGES/DRESSINGS) ×3 IMPLANT
ELECT REM PT RETURN 9FT ADLT (ELECTROSURGICAL) ×3
ELECTRODE REM PT RTRN 9FT ADLT (ELECTROSURGICAL) IMPLANT
GAUZE SPONGE 4X4 16PLY XRAY LF (GAUZE/BANDAGES/DRESSINGS) ×4 IMPLANT
GLOVE BIO SURGEON STRL SZ8.5 (GLOVE) ×6 IMPLANT
GOWN PREVENTION PLUS LG XLONG (DISPOSABLE) ×6 IMPLANT
GOWN PREVENTION PLUS XXLARGE (GOWN DISPOSABLE) ×3 IMPLANT
GOWN STRL REIN XL XLG (GOWN DISPOSABLE) ×6 IMPLANT
LOOP ANGLED CUTTING 22FR (CUTTING LOOP) IMPLANT
NDL HYPO 25X1 1.5 SAFETY (NEEDLE) ×2 IMPLANT
NEEDLE HYPO 25X1 1.5 SAFETY (NEEDLE) ×3 IMPLANT
NS IRRIG 1000ML POUR BTL (IV SOLUTION) ×3 IMPLANT
PACK ABDOMINAL GYN (CUSTOM PROCEDURE TRAY) ×3 IMPLANT
PACK HYSTEROSCOPY LF (CUSTOM PROCEDURE TRAY) ×2 IMPLANT
PAD OB MATERNITY 4.3X12.25 (PERSONAL CARE ITEMS) ×3 IMPLANT
SPONGE LAP 18X18 X RAY DECT (DISPOSABLE) ×3 IMPLANT
STAPLER VISISTAT 35W (STAPLE) ×3 IMPLANT
SUT CHROMIC 0 CT 1 (SUTURE) ×2 IMPLANT
SUT CHROMIC 1 (SUTURE) IMPLANT
SUT CHROMIC 1 CTX 36 (SUTURE) ×17 IMPLANT
SUT CHROMIC 1MO 4 18 CR8 (SUTURE) ×9 IMPLANT
SUT CHROMIC 2 0 SH (SUTURE) ×6 IMPLANT
SUT MON AB 4-0 PS1 27 (SUTURE) ×3 IMPLANT
SUT PLAIN 2 0 XLH (SUTURE) IMPLANT
SUT VIC AB 0 CT1 18XCR BRD8 (SUTURE) IMPLANT
SUT VIC AB 0 CT1 27 (SUTURE) ×3
SUT VIC AB 0 CT1 27XBRD ANBCTR (SUTURE) ×2 IMPLANT
SUT VIC AB 0 CT1 8-18 (SUTURE)
SYR CONTROL 10ML LL (SYRINGE) ×3 IMPLANT
TOWEL OR 17X24 6PK STRL BLUE (TOWEL DISPOSABLE) ×6 IMPLANT
TRAY FOLEY CATH 14FR (SET/KITS/TRAYS/PACK) ×3 IMPLANT
WATER STERILE IRR 1000ML POUR (IV SOLUTION) ×3 IMPLANT

## 2012-06-27 NOTE — Preoperative (Signed)
Beta Blockers   Reason not to administer Beta Blockers:Not Applicable 

## 2012-06-27 NOTE — Anesthesia Postprocedure Evaluation (Signed)
  Anesthesia Post-op Note  Patient: Bailey Morris  Procedure(s) Performed: Procedure(s) (LRB) with comments: MYOMECTOMY (N/A) - Abdominal (RADIOFREQUENCY) ABLATION ()  Patient Location: Women's Unit  Anesthesia Type:General  Level of Consciousness: awake, alert  and oriented  Airway and Oxygen Therapy: Patient Spontanous Breathing  Post-op Pain: mild  Post-op Assessment: Post-op Vital signs reviewed and Patient's Cardiovascular Status Stable  Post-op Vital Signs: Reviewed and stable  Complications: No apparent anesthesia complications

## 2012-06-27 NOTE — Anesthesia Preprocedure Evaluation (Signed)
Anesthesia Evaluation  Patient identified by MRN, date of birth, ID band Patient awake    Reviewed: Allergy & Precautions, H&P , NPO status , Patient's Chart, lab work & pertinent test results  History of Anesthesia Complications (+) PONV  Airway Mallampati: I TM Distance: >3 FB Neck ROM: full    Dental No notable dental hx. (+) Teeth Intact   Pulmonary neg pulmonary ROS,    Pulmonary exam normal       Cardiovascular negative cardio ROS      Neuro/Psych negative neurological ROS     GI/Hepatic negative GI ROS, Neg liver ROS,   Endo/Other  negative endocrine ROS  Renal/GU negative Renal ROS  negative genitourinary   Musculoskeletal negative musculoskeletal ROS (+)   Abdominal Normal abdominal exam  (+)   Peds negative pediatric ROS (+)  Hematology negative hematology ROS (+)   Anesthesia Other Findings   Reproductive/Obstetrics negative OB ROS                           Anesthesia Physical Anesthesia Plan  ASA: II  Anesthesia Plan: General   Post-op Pain Management:    Induction: Intravenous  Airway Management Planned: Oral ETT  Additional Equipment:   Intra-op Plan:   Post-operative Plan: Extubation in OR  Informed Consent: I have reviewed the patients History and Physical, chart, labs and discussed the procedure including the risks, benefits and alternatives for the proposed anesthesia with the patient or authorized representative who has indicated his/her understanding and acceptance.   Dental Advisory Given  Plan Discussed with: CRNA and Surgeon  Anesthesia Plan Comments:         Anesthesia Quick Evaluation

## 2012-06-27 NOTE — Transfer of Care (Signed)
Immediate Anesthesia Transfer of Care Note  Patient: Bailey Morris  Procedure(s) Performed: Procedure(s) (LRB) with comments: MYOMECTOMY (N/A) - Abdominal DILATATION AND CURETTAGE /HYSTEROSCOPY (N/A)  Patient Location: PACU  Anesthesia Type:General  Level of Consciousness: awake, alert , oriented and patient cooperative  Airway & Oxygen Therapy: Patient Spontanous Breathing and Patient connected to nasal cannula oxygen  Post-op Assessment: Report given to PACU RN and Post -op Vital signs reviewed and stable  Post vital signs: Reviewed and stable  Complications: No apparent anesthesia complications

## 2012-06-27 NOTE — Anesthesia Postprocedure Evaluation (Signed)
Anesthesia Post Note  Patient: Bailey Morris  Procedure(s) Performed: Procedure(s) (LRB): MYOMECTOMY (N/A) (RADIOFREQUENCY) ABLATION ()  Anesthesia type: General  Patient location: PACU  Post pain: Pain level controlled  Post assessment: Post-op Vital signs reviewed  Last Vitals:  Filed Vitals:   06/27/12 1115  BP: 117/59  Pulse: 62  Temp:   Resp: 20    Post vital signs: Reviewed  Level of consciousness: sedated  Complications: No apparent anesthesia complications

## 2012-06-27 NOTE — Addendum Note (Signed)
Addendum  created 06/27/12 1648 by Shanon Payor, CRNA   Modules edited:Notes Section

## 2012-06-27 NOTE — H&P (Signed)
  There has been no change history and physical since dictation was done

## 2012-06-27 NOTE — Op Note (Signed)
preop diagnosis multiple myomas Postop diagnosis multiple myomectomy endometrial curettage Surgeon Dr. Francoise Ceo First assistant Dr. Coral Ceo Anesthesia general Procedure patient placed on the operating table in the supine position abdomen prepped and draped bladder emptied with a Foley catheter a transverse incision made through the old scar carried down to the rectus fascia fascia cleaned and incised the length of the incision recti muscles retracted laterally peritoneum opened in size document the uterus was enlarged with multiple myomas OTT week size largest of which was a fundal there was apart from the large fundal myoma there was low a large myoma on the right lower segment it was 1 posterior lateral on the left side and multiple other small ones an incision was made across the fundus from one to the other and and myomas were showed out with blunt dissection after the surface myomas removed there were 3 more intramural myomas smaller which involved the cavity of the uterus these were removed and the cavity was rather small sharp curettage was done in tissue see if the pathology the uterus was closed in layers starting from the fundal region where they smaller fibroids involving cavity in certain areas the uterus was closed in 3 layers and and August 2 layers with interrupted sutures of #1 chromic the right tube was noticed to the patent left tube was involved and multiple adhesions of both ovaries normal after closure of the uterus hemostasis was satisfactory Interceed was placed across the incision and the abdomen closed in layers peritoneum continuous with of 0 chromic fascia continuous with of 0 Dexon and the skin shows a subcuticular stitch of 4-0 Monocryl blood loss was 600 cc patient tolerated the procedure well

## 2012-06-28 ENCOUNTER — Encounter (HOSPITAL_COMMUNITY): Payer: Self-pay | Admitting: Obstetrics

## 2012-06-28 LAB — CBC
HCT: 25.1 % — ABNORMAL LOW (ref 36.0–46.0)
MCHC: 34.3 g/dL (ref 30.0–36.0)
MCV: 89 fL (ref 78.0–100.0)
Platelets: 247 10*3/uL (ref 150–400)
RDW: 12.6 % (ref 11.5–15.5)
WBC: 17.9 10*3/uL — ABNORMAL HIGH (ref 4.0–10.5)

## 2012-06-28 LAB — RAPID HIV SCREEN (WH-MAU): Rapid HIV Screen: REACTIVE — AB

## 2012-06-28 MED ORDER — SODIUM CHLORIDE 0.9 % IJ SOLN
3.0000 mL | Freq: Two times a day (BID) | INTRAMUSCULAR | Status: DC
  Administered 2012-06-28 – 2012-06-29 (×3): 3 mL via INTRAVENOUS

## 2012-06-28 NOTE — Progress Notes (Signed)
Patient ID: Bailey Morris, female   DOB: [birth year], 42 y.o.   MRN: [id number] Postop day 1 Vital signs normal Abdomen soft bowel sounds good incision clean and dry no complaints doing well and

## 2012-06-28 NOTE — Progress Notes (Signed)
UR completed 

## 2012-06-29 MED ORDER — BISACODYL 10 MG RE SUPP
10.0000 mg | Freq: Every day | RECTAL | Status: DC | PRN
  Administered 2012-06-29: 10 mg via RECTAL
  Filled 2012-06-29: qty 1

## 2012-06-29 NOTE — Progress Notes (Signed)
Patient ID: Bailey Morris, female   DOB: 1971-03-21, 42 y.o.   MRN: [id number] Postop day 2 A vital signs normal abdomen soft good bowel sounds No complaints hemoglobin 8+ doing well

## 2012-06-30 MED ORDER — IBUPROFEN 800 MG PO TABS: 800.0000 mg | ORAL_TABLET | Freq: Three times a day (TID) | ORAL | Status: DC | PRN

## 2012-06-30 MED ORDER — OXYCODONE-ACETAMINOPHEN 5-325 MG PO TABS
1.0000 | ORAL_TABLET | ORAL | Status: DC | PRN
Start: 2012-06-30 — End: 2012-08-22

## 2012-06-30 MED ORDER — MAGNESIUM HYDROXIDE 400 MG/5ML PO SUSP: 30.0000 mL | Freq: Every day | ORAL | Status: DC | PRN

## 2012-06-30 NOTE — Progress Notes (Signed)
Subjective: Patient reports tolerating PO, + flatus and no problems voiding.    Objective: I have reviewed patient's vital signs, intake and output, medications and labs.  General: alert and no distress GI: soft, non-tender; bowel sounds normal; no masses,  no organomegaly and incision: clean, dry and intact Extremities: extremities normal, atraumatic, no cyanosis or edema Vaginal Bleeding: none   Assessment/Plan: S/P Myomectomy.  Doing well.  Discharge home.  LOS: 3 days    Sema Stangler A 06/30/2012, 9:21 AM

## 2012-06-30 NOTE — Discharge Summary (Signed)
Physician Discharge Summary  Patient ID: Bailey Morris MRN: [id number] DOB/AGE: 1971/02/28 42 y.o.  Admit date: 06/27/2012 Discharge date: 06/30/2012  Admission Diagnoses:  Discharge Diagnoses:  Active Problems:  * No active hospital problems. *    Discharged Condition: good  Hospital Course: Underwent myomectomies without complications.  Postpartum course uncomplicated.  Discharged home in good condition.  Consults: None  Significant Diagnostic Studies: labs: CBC  Treatments: analgesia: Percocet and surgery: Myomectomy  Discharge Exam: Blood pressure 112/67, pulse 91, temperature 98 F (36.7 C), temperature source Oral, resp. rate 18, height 5\' 6"  (1.676 m), weight 167 lb 8 oz (75.978 kg), SpO2 96.00%. General appearance: alert and no distress GI: normal findings: soft, non-tender and incision clean, dry and intact. Extremities: extremities normal, atraumatic, no cyanosis or edema Incision/Wound:  Disposition: 01-Home or Self Care  Discharge Orders    Future Appointments: Provider: Department: Dept Phone: Center:   08/08/2012 11:30 AM [facility] for Infectious Disease [phone] [facility]   08/22/2012 4:00 PM Cliffton Asters, MD [facility] for Infectious Disease [phone] [facility]     Future Orders Please Complete By Expires   Diet - low sodium heart healthy      Increase activity slowly      Discharge instructions      Comments:   Routine   Driving Restrictions      Comments:   No driving for 2 weeks.   Lifting restrictions      Comments:   No lifting > 20 lbs.   Sexual Activity Restrictions      Comments:   No sex for 6 weeks.   Other Restrictions      Comments:   Routine   Discharge wound care:      Comments:   Keep incision clean and dry.   Call MD for:      Call MD for:  temperature >100.4      Call MD for:  persistant nausea and vomiting      Call MD for:  severe uncontrolled pain      Call MD for:  redness,  tenderness, or signs of infection (pain, swelling, redness, odor or green/yellow discharge around incision site)      Call MD for:  difficulty breathing, headache or visual disturbances      Call MD for:  hives      Call MD for:  persistant dizziness or light-headedness      Call MD for:  extreme fatigue          Medication List     As of 06/30/2012  9:36 AM    STOP taking these medications         desonide 0.05 % ointment   Commonly known as: DESOWEN      HYDROcodone-acetaminophen 5-500 MG per tablet   Commonly known as: VICODIN      levocetirizine 5 MG tablet   Commonly known as: XYZAL      naproxen sodium 220 MG tablet   Commonly known as: ANAPROX      TAKE these medications         beclomethasone 40 MCG/ACT inhaler   Commonly known as: QVAR   Inhale 2 puffs into the lungs 2 (two) times daily as needed. For wheezing      Darunavir Ethanolate 800 MG tablet   Commonly known as: PREZISTA   Take 1 tablet (800 mg total) by mouth daily.      emtricitabine-tenofovir 200-300 MG per  tablet   Commonly known as: TRUVADA   Take 1 tablet by mouth daily.      ibuprofen 800 MG tablet   Commonly known as: ADVIL,MOTRIN   Take 1 tablet (800 mg total) by mouth every 8 (eight) hours as needed for pain.      montelukast 10 MG tablet   Commonly known as: SINGULAIR   Take 10 mg by mouth at bedtime.      oxyCODONE-acetaminophen 5-325 MG per tablet   Commonly known as: PERCOCET/ROXICET   Take 1-2 tablets by mouth every 4 (four) hours as needed for pain (moderate to severe pain (when tolerating fluids)).      ritonavir 100 MG Tabs   Commonly known as: NORVIR   Take 1 tablet (100 mg total) by mouth daily.           Follow-up Information    Follow up with MARSHALL,BERNARD A, MD. Schedule an appointment as soon as possible for a visit in 2 weeks.   Contact information:   [address] [place] [address] [phone]          Signed: Bodee Lafoe  A 06/30/2012, 9:36 AM

## 2012-06-30 NOTE — Progress Notes (Signed)
Discharge instructions reviewed with patient.  Patient states understanding of home care, signs/symptoms to report to MD, activity, medications and return appointment.  No home equipment needed.  Wheelchair to car with staff without incident and discharged to home with parents.

## 2012-07-02 NOTE — Progress Notes (Signed)
Post discharge review completed per request. 

## 2012-08-08 ENCOUNTER — Other Ambulatory Visit: Payer: BC Managed Care – PPO

## 2012-08-08 DIAGNOSIS — B20 Human immunodeficiency virus [HIV] disease: Secondary | ICD-10-CM

## 2012-08-08 LAB — CBC
HCT: 34.8 % — ABNORMAL LOW (ref 36.0–46.0)
MCH: 27.5 pg (ref 26.0–34.0)
MCHC: 32.8 g/dL (ref 30.0–36.0)
MCV: 84.1 fL (ref 78.0–100.0)
Platelets: 374 10*3/uL (ref 150–400)
RDW: 13.9 % (ref 11.5–15.5)
WBC: 6.3 10*3/uL (ref 4.0–10.5)

## 2012-08-08 LAB — LIPID PANEL
Cholesterol: 154 mg/dL (ref 0–200)
HDL: 51 mg/dL (ref >39)
LDL Cholesterol: 89 mg/dL (ref 0–99)
Total CHOL/HDL Ratio: 3 Ratio
Triglycerides: 72 mg/dL (ref <150)
VLDL: 14 mg/dL (ref 0–40)

## 2012-08-08 LAB — COMPLETE METABOLIC PANEL WITH GFR
ALT: 12 U/L (ref 0–35)
AST: 14 U/L (ref 0–37)
Alkaline Phosphatase: 63 U/L (ref 39–117)
BUN: 19 mg/dL (ref 6–23)
Calcium: 9.4 mg/dL (ref 8.4–10.5)
Creat: 1.08 mg/dL (ref 0.50–1.10)
Total Bilirubin: 0.4 mg/dL (ref 0.3–1.2)

## 2012-08-09 LAB — T-HELPER CELL (CD4) - (RCID CLINIC ONLY): CD4 % Helper T Cell: 36 % (ref 33–55)

## 2012-08-22 ENCOUNTER — Ambulatory Visit (INDEPENDENT_AMBULATORY_CARE_PROVIDER_SITE_OTHER): Payer: BC Managed Care – PPO | Admitting: Internal Medicine

## 2012-08-22 ENCOUNTER — Encounter: Payer: Self-pay | Admitting: Internal Medicine

## 2012-08-22 VITALS — BP 121/84 | HR 82 | Temp 97.9°F | Ht 65.5 in | Wt 170.2 lb

## 2012-08-22 DIAGNOSIS — B20 Human immunodeficiency virus [HIV] disease: Secondary | ICD-10-CM

## 2012-08-22 NOTE — Progress Notes (Signed)
Patient ID: Bailey Morris, female   DOB: 1970-07-15, 42 y.o.   MRN: [id number]          [facility] for Infectious Disease  Patient Active Problem List  Diagnosis  . HIV DISEASE  . VENEREAL WART  . MYOMA  . DEPRESSION, MILD  . ASTHMA  . MENORRHAGIA  . SCOLIOSIS  . PROTEINURIA  . SHINGLES, HX OF    Patient's Medications  New Prescriptions   No medications on file  Previous Medications   BECLOMETHASONE (QVAR) 40 MCG/ACT INHALER    Inhale 2 puffs into the lungs 2 (two) times daily as needed. For wheezing   DARUNAVIR ETHANOLATE (PREZISTA) 800 MG TABLET    Take 1 tablet (800 mg total) by mouth daily.   EMTRICITABINE-TENOFOVIR (TRUVADA) 200-300 MG PER TABLET    Take 1 tablet by mouth daily.   FERROUS SULFATE 325 (65 FE) MG TABLET    Take 325 mg by mouth daily with breakfast.   IBUPROFEN (ADVIL,MOTRIN) 800 MG TABLET    Take 1 tablet (800 mg total) by mouth every 8 (eight) hours as needed for pain.   MONTELUKAST (SINGULAIR) 10 MG TABLET    Take 10 mg by mouth at bedtime.    RITONAVIR (NORVIR) 100 MG TABS    Take 1 tablet (100 mg total) by mouth daily.  Modified Medications   No medications on file  Discontinued Medications   OXYCODONE-ACETAMINOPHEN (PERCOCET/ROXICET) 5-325 MG PER TABLET    Take 1-2 tablets by mouth every 4 (four) hours as needed for pain (moderate to severe pain (when tolerating fluids)).    Subjective: Bailey Morris is in for her routine visit. She underwent uterine myomectomy in January. She had some postop anemia which is resolving with supplemental iron and time. She states that in the transition from the hospital to a brief stay at her parents home postoperatively she missed 2 doses of her HIV medications. She has not missed any doses since. She is feeling well.  Objective: Temp: 97.9 F (36.6 C) (03/05 1612) Temp src: Oral (03/05 1612) BP: 121/84 mmHg (03/05 1612) Pulse Rate: 82 (03/05 1612)  General: she is in good spirits as usual Skin: no rash Lungs:  clear Cor: regular S1 and S2 no murmurs  Lab Results HIV 1 RNA Quant (copies/mL)  Date Value  08/08/2012 <20   04/24/2012 <20   10/20/2011 <20      CD4 T Cell Abs (cmm)  Date Value  08/08/2012 850   04/24/2012 870   10/20/2011 760    Lab Results  Component Value Date   WBC 6.3 08/08/2012   HGB 11.4* 08/08/2012   HCT 34.8* 08/08/2012   MCV 84.1 08/08/2012   PLT 374 08/08/2012     Assessment: Her HIV infection remains under excellent control with her current salvage regimen. She will stay on this while she attempts in vitro fertilization to become pregnant.  Plan: 1. Continue current antiretroviral regimen 2. Followup after lab work in 6 months, sooner if she becomes pregnant   Cliffton Asters, MD [facility] for Infectious Disease [facility] [phone] pager   [phone] cell 08/22/2012, 4:41 PM

## 2012-09-07 ENCOUNTER — Other Ambulatory Visit: Payer: Self-pay | Admitting: Licensed Clinical Social Worker

## 2012-12-17 ENCOUNTER — Telehealth: Payer: Self-pay | Admitting: *Deleted

## 2012-12-17 NOTE — Telephone Encounter (Signed)
Requested PAP smear results be faxed to [facility].

## 2012-12-28 ENCOUNTER — Encounter: Payer: Self-pay | Admitting: Obstetrics

## 2013-01-09 ENCOUNTER — Other Ambulatory Visit: Payer: Self-pay | Admitting: Licensed Clinical Social Worker

## 2013-01-09 DIAGNOSIS — B20 Human immunodeficiency virus [HIV] disease: Secondary | ICD-10-CM

## 2013-01-09 MED ORDER — EMTRICITABINE-TENOFOVIR DF 200-300 MG PO TABS: 1.0000 | ORAL_TABLET | Freq: Every day | ORAL | Status: DC

## 2013-01-14 ENCOUNTER — Other Ambulatory Visit: Payer: Self-pay | Admitting: *Deleted

## 2013-01-14 DIAGNOSIS — B20 Human immunodeficiency virus [HIV] disease: Secondary | ICD-10-CM

## 2013-01-14 MED ORDER — EMTRICITABINE-TENOFOVIR DF 200-300 MG PO TABS: 1.0000 | ORAL_TABLET | Freq: Every day | ORAL | Status: DC

## 2013-02-14 ENCOUNTER — Other Ambulatory Visit: Payer: BC Managed Care – PPO

## 2013-02-14 DIAGNOSIS — B20 Human immunodeficiency virus [HIV] disease: Secondary | ICD-10-CM

## 2013-02-15 LAB — HIV-1 RNA QUANT-NO REFLEX-BLD: HIV 1 RNA Quant: <20 copies/mL (ref <20)

## 2013-02-15 LAB — T-HELPER CELL (CD4) - (RCID CLINIC ONLY): CD4 % Helper T Cell: 39 % (ref 33–55)

## 2013-02-21 ENCOUNTER — Other Ambulatory Visit (HOSPITAL_COMMUNITY): Payer: Self-pay | Admitting: Obstetrics

## 2013-02-21 ENCOUNTER — Other Ambulatory Visit: Payer: Self-pay | Admitting: Obstetrics

## 2013-02-21 DIAGNOSIS — Z1231 Encounter for screening mammogram for malignant neoplasm of breast: Secondary | ICD-10-CM

## 2013-02-28 ENCOUNTER — Ambulatory Visit (INDEPENDENT_AMBULATORY_CARE_PROVIDER_SITE_OTHER): Payer: BC Managed Care – PPO | Admitting: Internal Medicine

## 2013-02-28 VITALS — BP 119/81 | HR 80 | Temp 98.1°F | Ht 65.0 in | Wt 182.5 lb

## 2013-02-28 DIAGNOSIS — B20 Human immunodeficiency virus [HIV] disease: Secondary | ICD-10-CM

## 2013-02-28 DIAGNOSIS — Z23 Encounter for immunization: Secondary | ICD-10-CM

## 2013-02-28 NOTE — Progress Notes (Signed)
Patient ID: Bailey Morris, female   DOB: 10-Mar-1971, 42 y.o.   MRN: [id number]          [facility] for Infectious Disease  Patient Active Problem List   Diagnosis Date Noted  . HIV DISEASE 01/10/2007    Priority: High  . ASTHMA 08/10/2006    Priority: Medium  . PROTEINURIA 07/21/2009  . MYOMA 07/11/2007  . VENEREAL WART 08/10/2006  . DEPRESSION, MILD 08/10/2006  . MENORRHAGIA 08/10/2006  . SCOLIOSIS 08/10/2006  . SHINGLES, HX OF 08/10/2006    Patient's Medications  New Prescriptions   No medications on file  Previous Medications   BECLOMETHASONE (QVAR) 40 MCG/ACT INHALER    Inhale 2 puffs into the lungs 2 (two) times daily as needed. For wheezing   DARUNAVIR ETHANOLATE (PREZISTA) 800 MG TABLET    Take 1 tablet (800 mg total) by mouth daily.   EMTRICITABINE-TENOFOVIR (TRUVADA) 200-300 MG PER TABLET    Take 1 tablet by mouth daily.   IBUPROFEN (ADVIL,MOTRIN) 800 MG TABLET    Take 1 tablet (800 mg total) by mouth every 8 (eight) hours as needed for pain.   LETROZOLE (FEMARA) 2.5 MG TABLET    Take 5 mg by mouth daily.   LEVOCETIRIZINE (XYZAL) 5 MG TABLET    Take 5 mg by mouth every evening.   MONTELUKAST (SINGULAIR) 10 MG TABLET    Take 10 mg by mouth at bedtime.    PRENATAL VIT-FE FUMARATE-FA (MULTIVITAMIN-PRENATAL) 27-0.8 MG TABS TABLET    Take 1 tablet by mouth daily at 12 noon.   RITONAVIR (NORVIR) 100 MG TABS    Take 1 tablet (100 mg total) by mouth daily.  Modified Medications   No medications on file  Discontinued Medications   FERROUS SULFATE 325 (65 FE) MG TABLET    Take 325 mg by mouth daily with breakfast.    Subjective: Bailey Morris is in for her routine visit. She is recovered from her uterine myomectomy in January is continuing her attempt to become pregnant through in vitro fertilization. She will be starting Femara this weekend and then will undergo testing to see if she is ovulating. She finds that taking her current antiretroviral regimen of Truvada, Prezista  and Norvir is not is convenient has one Atripla at bedtime but she has not had any trouble tolerating the medications and has not missed any doses. She is working with Dr. Morrison Old, a fertility specialist, in [facility].  Review of Systems: Pertinent items are noted in HPI.  Past Medical History  Diagnosis Date  . HIV positive   . PONV (postoperative nausea and vomiting)   . Asthma     History  Substance Use Topics  . Smoking status: Morris Smoker   . Smokeless tobacco: Morris Used  . Alcohol Use: 0.0 oz/week    0.1 drink(s) per week     Comment: socially    No family history on file.  Allergies  Allergen Reactions  . Sulfamethoxazole W-Trimethoprim Other (See Comments)    REACTION: Severe Joint pain.  Marland Kitchen Fluticasone-Salmeterol Anxiety    Made jitteriness    Objective: Temp: 98.1 F (36.7 C) (09/11 1120) Temp src: Oral (09/11 1120) BP: 119/81 mmHg (09/11 1120) Pulse Rate: 80 (09/11 1120)  General: She is in good spirits as usual Oral: No oropharyngeal lesions Skin: No rash Lungs: clear  Cor: Reg S1 and S2 with no murmurs  Lab Results HIV 1 RNA Quant (copies/mL)  Date Value  02/14/2013 <20   08/08/2012 <  20   04/24/2012 <20      CD4 T Cell Abs (/uL)  Date Value  02/14/2013 880   08/08/2012 850   04/24/2012 870      Lab Results  Component Value Date   WBC 6.3 08/08/2012   HGB 11.4* 08/08/2012   HCT 34.8* 08/08/2012   MCV 84.1 08/08/2012   PLT 374 08/08/2012   BMET    Component Value Date/Time   NA 138 08/08/2012 1215   K 4.3 08/08/2012 1215   CL 104 08/08/2012 1215   CO2 28 08/08/2012 1215   GLUCOSE 70 08/08/2012 1215   BUN 19 08/08/2012 1215   CREATININE 1.08 08/08/2012 1215   CREATININE 0.90 06/27/2012 1345   CALCIUM 9.4 08/08/2012 1215   GFRNONAA 79* 06/27/2012 1345   GFRAA >90 06/27/2012 1345   Lab Results  Component Value Date   ALT 12 08/08/2012   AST 14 08/08/2012   ALKPHOS 63 08/08/2012   BILITOT 0.4 08/08/2012   Assessment: Her HIV infection  remains under excellent control. She will need to stay on her current regimen as long as she is attempting to get pregnant or pregnant.  Plan: 1. Continue current antiretroviral regimen 2. Followup in 3 months after lab work   Cliffton Asters, MD [facility] for Infectious Disease [facility] [phone] pager   [phone] cell 02/28/2013, 11:35 AM

## 2013-03-07 ENCOUNTER — Ambulatory Visit
Admission: RE | Admit: 2013-03-07 | Discharge: 2013-03-07 | Disposition: A | Discharge status: Home / Self Care | Payer: BC Managed Care – PPO | Source: Ambulatory Visit | Attending: Obstetrics | Admitting: Obstetrics

## 2013-03-07 DIAGNOSIS — Z1231 Encounter for screening mammogram for malignant neoplasm of breast: Secondary | ICD-10-CM

## 2013-03-25 ENCOUNTER — Other Ambulatory Visit: Payer: Self-pay | Admitting: *Deleted

## 2013-03-25 DIAGNOSIS — B20 Human immunodeficiency virus [HIV] disease: Secondary | ICD-10-CM

## 2013-03-25 MED ORDER — DARUNAVIR ETHANOLATE 800 MG PO TABS: 800.0000 mg | ORAL_TABLET | Freq: Every day | ORAL | Status: DC

## 2013-03-25 MED ORDER — EMTRICITABINE-TENOFOVIR DF 200-300 MG PO TABS: 1.0000 | ORAL_TABLET | Freq: Every day | ORAL | Status: DC

## 2013-03-25 MED ORDER — RITONAVIR 100 MG PO TABS: 100.0000 mg | ORAL_TABLET | Freq: Every day | ORAL | Status: DC

## 2013-04-01 ENCOUNTER — Other Ambulatory Visit: Payer: Self-pay | Admitting: *Deleted

## 2013-04-01 DIAGNOSIS — B20 Human immunodeficiency virus [HIV] disease: Secondary | ICD-10-CM

## 2013-04-01 MED ORDER — EMTRICITABINE-TENOFOVIR DF 200-300 MG PO TABS: 1.0000 | ORAL_TABLET | Freq: Every day | ORAL | Status: DC

## 2013-04-01 MED ORDER — DARUNAVIR ETHANOLATE 800 MG PO TABS: 800.0000 mg | ORAL_TABLET | Freq: Every day | ORAL | Status: DC

## 2013-04-01 MED ORDER — RITONAVIR 100 MG PO TABS: 100.0000 mg | ORAL_TABLET | Freq: Every day | ORAL | Status: DC

## 2013-04-05 ENCOUNTER — Other Ambulatory Visit: Payer: Self-pay | Admitting: *Deleted

## 2013-04-05 DIAGNOSIS — B20 Human immunodeficiency virus [HIV] disease: Secondary | ICD-10-CM

## 2013-04-05 MED ORDER — DARUNAVIR ETHANOLATE 800 MG PO TABS: 800.0000 mg | ORAL_TABLET | Freq: Every day | ORAL | Status: DC

## 2013-04-05 MED ORDER — RITONAVIR 100 MG PO TABS: 100.0000 mg | ORAL_TABLET | Freq: Every day | ORAL | Status: DC

## 2013-04-05 MED ORDER — EMTRICITABINE-TENOFOVIR DF 200-300 MG PO TABS: 1.0000 | ORAL_TABLET | Freq: Every day | ORAL | Status: DC

## 2013-05-29 ENCOUNTER — Other Ambulatory Visit: Payer: BC Managed Care – PPO

## 2013-05-29 DIAGNOSIS — B20 Human immunodeficiency virus [HIV] disease: Secondary | ICD-10-CM

## 2013-05-29 LAB — CBC
Hemoglobin: 13.2 g/dL (ref 12.0–15.0)
MCHC: 34.7 g/dL (ref 30.0–36.0)
Platelets: 306 10*3/uL (ref 150–400)
RBC: 4.4 MIL/uL (ref 3.87–5.11)
RDW: 14.1 % (ref 11.5–15.5)

## 2013-05-29 LAB — COMPLETE METABOLIC PANEL WITH GFR
AST: 15 U/L (ref 0–37)
Albumin: 4.1 g/dL (ref 3.5–5.2)
Alkaline Phosphatase: 65 U/L (ref 39–117)
GFR, Est African American: >89 mL/min
GFR, Est Non African American: 84 mL/min
Potassium: 4 mEq/L (ref 3.5–5.3)
Sodium: 138 mEq/L (ref 135–145)
Total Bilirubin: 0.4 mg/dL (ref 0.3–1.2)
Total Protein: 7.2 g/dL (ref 6.0–8.3)

## 2013-05-30 LAB — HIV-1 RNA QUANT-NO REFLEX-BLD
HIV 1 RNA Quant: <20 copies/mL (ref <20)
HIV-1 RNA Quant, Log: <1.3 {Log} (ref <1.30)

## 2013-05-30 LAB — T-HELPER CELL (CD4) - (RCID CLINIC ONLY): CD4 % Helper T Cell: 41 % (ref 33–55)

## 2013-06-11 ENCOUNTER — Ambulatory Visit (INDEPENDENT_AMBULATORY_CARE_PROVIDER_SITE_OTHER): Payer: BC Managed Care – PPO | Admitting: Internal Medicine

## 2013-06-11 VITALS — BP 134/84 | HR 73 | Temp 97.9°F | Ht 65.0 in | Wt 186.8 lb

## 2013-06-11 DIAGNOSIS — B20 Human immunodeficiency virus [HIV] disease: Secondary | ICD-10-CM

## 2013-06-11 NOTE — Progress Notes (Signed)
Patient ID: Bailey Morris, female   DOB: [birth year], 42 y.o.   MRN: [id number]          [facility] for Infectious Disease  Patient Active Problem List   Diagnosis Date Noted  . HIV DISEASE 01/10/2007    Priority: High  . ASTHMA 08/10/2006    Priority: Medium  . PROTEINURIA 07/21/2009  . MYOMA 07/11/2007  . VENEREAL WART 08/10/2006  . DEPRESSION, MILD 08/10/2006  . MENORRHAGIA 08/10/2006  . SCOLIOSIS 08/10/2006  . SHINGLES, HX OF 08/10/2006    Patient's Medications  New Prescriptions   No medications on file  Previous Medications   BECLOMETHASONE (QVAR) 40 MCG/ACT INHALER    Inhale 2 puffs into the lungs 2 (two) times daily as needed. For wheezing   DARUNAVIR ETHANOLATE (PREZISTA) 800 MG TABLET    Take 1 tablet (800 mg total) by mouth daily.   EMTRICITABINE-TENOFOVIR (TRUVADA) 200-300 MG PER TABLET    Take 1 tablet by mouth daily.   IBUPROFEN (ADVIL,MOTRIN) 800 MG TABLET    Take 1 tablet (800 mg total) by mouth every 8 (eight) hours as needed for pain.   LETROZOLE (FEMARA) 2.5 MG TABLET    Take 2.5 mg by mouth daily. Take by mouth on days 3-7 of menstrual cycle   LEVOCETIRIZINE (XYZAL) 5 MG TABLET    Take 5 mg by mouth every evening.   MONTELUKAST (SINGULAIR) 10 MG TABLET    Take 10 mg by mouth at bedtime.    PRENATAL VIT-FE FUMARATE-FA (MULTIVITAMIN-PRENATAL) 27-0.8 MG TABS TABLET    Take 1 tablet by mouth daily at 12 noon.   RITONAVIR (NORVIR) 100 MG TABS TABLET    Take 1 tablet (100 mg total) by mouth daily.  Modified Medications   No medications on file  Discontinued Medications   No medications on file    Subjective: [name] is in for her routine followup visit. She has not had any problems tolerating her Truvada, Prezista or Norvir. She says that she may have been late taking her medications on one or 2 occasions but does not believe she has missed any doses since her last visit. She remained somewhat depressed and that she has not been able to become pregnant  through in vitro fertilization. She continues to take Femara each month and during her menstrual cycle and has had 2 rounds of hormonal injections without becoming pregnant. Review of Systems: Pertinent items are noted in HPI.  Past Medical History  Diagnosis Date  . HIV positive   . PONV (postoperative nausea and vomiting)   . Asthma     History  Substance Use Topics  . Smoking status: Morris Smoker   . Smokeless tobacco: Morris Used  . Alcohol Use: 0.0 oz/week    0.1 drink(s) per week     Comment: socially    No family history on file.  Allergies  Allergen Reactions  . Sulfamethoxazole-Trimethoprim Other (See Comments)    REACTION: Severe Joint pain.  Marland Kitchen Fluticasone-Salmeterol Anxiety    Made jitteriness    Objective: Temp: 97.9 F (36.6 C) (12/23 1133) Temp src: Oral (12/23 1133) BP: 134/84 mmHg (12/23 1133) Pulse Rate: 73 (12/23 1133)  General: She is tearful during the exam Oral: No oropharyngeal lesions Skin: No rash Lungs: Clear Cor: Regular S1-S2 no murmurs  Lab Results Lab Results  Component Value Date   WBC 6.7 05/29/2013   HGB 13.2 05/29/2013   HCT 38.0 05/29/2013   MCV 86.4 05/29/2013   PLT 306 05/29/2013  Lab Results  Component Value Date   CREATININE 0.86 05/29/2013   BUN 12 05/29/2013   NA 138 05/29/2013   K 4.0 05/29/2013   CL 104 05/29/2013   CO2 27 05/29/2013    Lab Results  Component Value Date   ALT 13 05/29/2013   AST 15 05/29/2013   ALKPHOS 65 05/29/2013   BILITOT 0.4 05/29/2013    Lab Results  Component Value Date   CHOL 154 08/08/2012   HDL 51 08/08/2012   LDLCALC 89 08/08/2012   TRIG 72 08/08/2012   CHOLHDL 3.0 08/08/2012    Lab Results HIV 1 RNA Quant (copies/mL)  Date Value  05/29/2013 <20   02/14/2013 <20   08/08/2012 <20      CD4 T Cell Abs (/uL)  Date Value  05/29/2013 940   02/14/2013 880   08/08/2012 850      Lab Results  Component Value Date   WBC 6.7 05/29/2013   HGB 13.2 05/29/2013   HCT 38.0  05/29/2013   MCV 86.4 05/29/2013   PLT 306 05/29/2013   BMET    Component Value Date/Time   NA 138 05/29/2013 1210   K 4.0 05/29/2013 1210   CL 104 05/29/2013 1210   CO2 27 05/29/2013 1210   GLUCOSE 84 05/29/2013 1210   BUN 12 05/29/2013 1210   CREATININE 0.86 05/29/2013 1210   CREATININE 0.90 06/27/2012 1345   CALCIUM 9.5 05/29/2013 1210   GFRNONAA 79* 06/27/2012 1345   GFRAA >90 06/27/2012 1345    Assessment: Her HIV infection remains under excellent control. I will continue her current antiretroviral regimen as she is still trying to become pregnant. She continues to have a great deal of difficulty living with HIV infection and is afraid to disclose her status to others. I offered to arrange counseling for her but she declines.  Plan: 1. Continue current antiretroviral regimen 2. Followup after lab work in 6 months, sooner if she becomes pregnant   Cliffton Asters, MD [facility] for Infectious Disease [facility] [phone] pager   [phone] cell 06/11/2013, 11:57 AM

## 2013-06-26 ENCOUNTER — Telehealth: Payer: Self-pay | Admitting: *Deleted

## 2013-06-26 DIAGNOSIS — B20 Human immunodeficiency virus [HIV] disease: Secondary | ICD-10-CM

## 2013-06-26 MED ORDER — RITONAVIR 100 MG PO TABS: 100.0000 mg | ORAL_TABLET | Freq: Every day | ORAL | Status: DC

## 2013-06-26 MED ORDER — DARUNAVIR ETHANOLATE 800 MG PO TABS: 800.0000 mg | ORAL_TABLET | Freq: Every day | ORAL | Status: DC

## 2013-06-26 MED ORDER — EMTRICITABINE-TENOFOVIR DF 200-300 MG PO TABS: 1.0000 | ORAL_TABLET | Freq: Every day | ORAL | Status: DC

## 2013-06-26 NOTE — Telephone Encounter (Signed)
Confirmed receipt of prescriptions with [facility].  They will notify the patient upon shipment. Landis Gandy, RN

## 2013-06-26 NOTE — Telephone Encounter (Signed)
Pt requesting rxes be sent to [facility].

## 2013-08-09 DIAGNOSIS — H65199 Other acute nonsuppurative otitis media, unspecified ear: Secondary | ICD-10-CM | POA: Insufficient documentation

## 2013-08-14 DIAGNOSIS — H6980 Other specified disorders of Eustachian tube, unspecified ear: Secondary | ICD-10-CM | POA: Insufficient documentation

## 2013-08-14 DIAGNOSIS — J0100 Acute maxillary sinusitis, unspecified: Secondary | ICD-10-CM | POA: Insufficient documentation

## 2013-11-26 ENCOUNTER — Other Ambulatory Visit (INDEPENDENT_AMBULATORY_CARE_PROVIDER_SITE_OTHER): Payer: 59

## 2013-11-26 DIAGNOSIS — B20 Human immunodeficiency virus [HIV] disease: Secondary | ICD-10-CM

## 2013-11-26 DIAGNOSIS — Z79899 Other long term (current) drug therapy: Secondary | ICD-10-CM

## 2013-11-26 DIAGNOSIS — Z113 Encounter for screening for infections with a predominantly sexual mode of transmission: Secondary | ICD-10-CM

## 2013-11-26 LAB — LIPID PANEL
CHOL/HDL RATIO: 2.6 ratio
CHOLESTEROL: 140 mg/dL (ref 0–200)
HDL: 54 mg/dL (ref >39)
LDL Cholesterol: 71 mg/dL (ref 0–99)
Triglycerides: 73 mg/dL (ref <150)
VLDL: 15 mg/dL (ref 0–40)

## 2013-11-26 LAB — COMPREHENSIVE METABOLIC PANEL
ALBUMIN: 4 g/dL (ref 3.5–5.2)
ALT: 15 U/L (ref 0–35)
AST: 18 U/L (ref 0–37)
Alkaline Phosphatase: 63 U/L (ref 39–117)
BUN: 13 mg/dL (ref 6–23)
CALCIUM: 9.4 mg/dL (ref 8.4–10.5)
CHLORIDE: 105 meq/L (ref 96–112)
CO2: 29 meq/L (ref 19–32)
CREATININE: 0.95 mg/dL (ref 0.50–1.10)
Glucose, Bld: 77 mg/dL (ref 70–99)
POTASSIUM: 4.4 meq/L (ref 3.5–5.3)
Sodium: 140 mEq/L (ref 135–145)
TOTAL PROTEIN: 7.1 g/dL (ref 6.0–8.3)
Total Bilirubin: 0.4 mg/dL (ref 0.2–1.2)

## 2013-11-26 LAB — CBC
HEMATOCRIT: 38.5 % (ref 36.0–46.0)
Hemoglobin: 12.9 g/dL (ref 12.0–15.0)
MCH: 29.1 pg (ref 26.0–34.0)
MCHC: 33.5 g/dL (ref 30.0–36.0)
MCV: 86.7 fL (ref 78.0–100.0)
PLATELETS: 319 10*3/uL (ref 150–400)
RBC: 4.44 MIL/uL (ref 3.87–5.11)
RDW: 13.8 % (ref 11.5–15.5)
WBC: 6.7 10*3/uL (ref 4.0–10.5)

## 2013-11-27 LAB — T-HELPER CELL (CD4) - (RCID CLINIC ONLY)
CD4 T CELL HELPER: 38 % (ref 33–55)
CD4 T Cell Abs: 900 /uL (ref 400–2700)

## 2013-11-27 LAB — RPR

## 2013-11-27 LAB — HIV-1 RNA QUANT-NO REFLEX-BLD: HIV 1 RNA Quant: <20 copies/mL (ref <20)

## 2013-12-10 ENCOUNTER — Encounter: Payer: Self-pay | Admitting: Internal Medicine

## 2013-12-10 ENCOUNTER — Ambulatory Visit (INDEPENDENT_AMBULATORY_CARE_PROVIDER_SITE_OTHER): Payer: 59 | Admitting: Internal Medicine

## 2013-12-10 VITALS — BP 126/86 | HR 75 | Temp 98.4°F | Wt 176.5 lb

## 2013-12-10 DIAGNOSIS — B20 Human immunodeficiency virus [HIV] disease: Secondary | ICD-10-CM

## 2013-12-10 MED ORDER — EFAVIRENZ-EMTRICITAB-TENOFOVIR 600-200-300 MG PO TABS: 1.0000 | ORAL_TABLET | Freq: Every day | ORAL | Status: DC

## 2013-12-10 NOTE — Progress Notes (Signed)
Patient ID: Bailey Morris, female   DOB: 01/25/1971, 43 y.o.   MRN: [id number]          Patient Active Problem List   Diagnosis Date Noted  . HIV DISEASE 01/10/2007    Priority: High  . ASTHMA 08/10/2006    Priority: Medium  . PROTEINURIA 07/21/2009  . MYOMA 07/11/2007  . VENEREAL WART 08/10/2006  . DEPRESSION, MILD 08/10/2006  . MENORRHAGIA 08/10/2006  . SCOLIOSIS 08/10/2006  . SHINGLES, HX OF 08/10/2006    Patient's Medications  New Prescriptions   EFAVIRENZ-EMTRICITABINE-TENOFOVIR (ATRIPLA) 600-200-300 MG PER TABLET    Take 1 tablet by mouth at bedtime.  Previous Medications   BECLOMETHASONE (QVAR) 40 MCG/ACT INHALER    Inhale 2 puffs into the lungs 2 (two) times daily as needed. For wheezing   IBUPROFEN (ADVIL,MOTRIN) 800 MG TABLET    Take 1 tablet (800 mg total) by mouth every 8 (eight) hours as needed for pain.   LEVOCETIRIZINE (XYZAL) 5 MG TABLET    Take 5 mg by mouth every evening.   MONTELUKAST (SINGULAIR) 10 MG TABLET    Take 10 mg by mouth at bedtime.   Modified Medications   No medications on file  Discontinued Medications   DARUNAVIR ETHANOLATE (PREZISTA) 800 MG TABLET    Take 1 tablet (800 mg total) by mouth daily.   EMTRICITABINE-TENOFOVIR (TRUVADA) 200-300 MG PER TABLET    Take 1 tablet by mouth daily.   LETROZOLE (FEMARA) 2.5 MG TABLET    Take 2.5 mg by mouth daily. Take by mouth on days 3-7 of menstrual cycle   PRENATAL VIT-FE FUMARATE-FA (MULTIVITAMIN-PRENATAL) 27-0.8 MG TABS TABLET    Take 1 tablet by mouth daily at 12 noon.   RITONAVIR (NORVIR) 100 MG TABS TABLET    Take 1 tablet (100 mg total) by mouth daily.    Subjective: Bailey Morris is in for her routine visit. She has not been able to become pregnant working with her fertility specialist and states that she can no longer afford the exorbitant co-pays. She is also struggling with the 3 co-pays for her current antiretroviral regimen. She states that she has given up on her effort to become pregnant and  wants to change back to Atripla. Review of Systems: Pertinent items are noted in HPI.  Past Medical History  Diagnosis Date  . HIV positive   . PONV (postoperative nausea and vomiting)   . Asthma     History  Substance Use Topics  . Smoking status: Never Smoker   . Smokeless tobacco: Never Used  . Alcohol Use: 0.0 oz/week    0.1 drink(s) per week     Comment: socially    No family history on file.  Allergies  Allergen Reactions  . Sulfamethoxazole-Trimethoprim Other (See Comments)    REACTION: Severe Joint pain.  Marland Kitchen Fluticasone-Salmeterol Anxiety    Made jitteriness    Objective: Temp: 98.4 F (36.9 C) (06/23 1136) Temp src: Oral (06/23 1136) BP: 126/86 mmHg (06/23 1136) Pulse Rate: 75 (06/23 1136) Body mass index is 29.37 kg/(m^2).  General: She is in reasonably good spirits Oral: No oropharyngeal lesions Skin: No rash Lungs: Clear Cor: Regular S1 and S2 with no murmurs  Lab Results Lab Results  Component Value Date   WBC 6.7 11/26/2013   HGB 12.9 11/26/2013   HCT 38.5 11/26/2013   MCV 86.7 11/26/2013   PLT 319 11/26/2013    Lab Results  Component Value Date   CREATININE 0.95 11/26/2013   BUN  13 11/26/2013   NA 140 11/26/2013   K 4.4 11/26/2013   CL 105 11/26/2013   CO2 29 11/26/2013    Lab Results  Component Value Date   ALT 15 11/26/2013   AST 18 11/26/2013   ALKPHOS 63 11/26/2013   BILITOT 0.4 11/26/2013    Lab Results  Component Value Date   CHOL 140 11/26/2013   HDL 54 11/26/2013   LDLCALC 71 11/26/2013   TRIG 73 11/26/2013   CHOLHDL 2.6 11/26/2013    Lab Results HIV 1 RNA Quant (copies/mL)  Date Value  11/26/2013 <20   05/29/2013 <20   02/14/2013 <20      CD4 T Cell Abs (/uL)  Date Value  11/26/2013 900   05/29/2013 940   02/14/2013 880      Assessment: I will simplify her regimen and change her back to Atripla.  Plan: 1. Change her regimen back to Atripla 2. Follow up the lab work in 6 months   Michel Bickers, MD [facility] for [place] [phone] pager   [phone] cell 12/10/2013, 11:49 AM

## 2014-01-24 ENCOUNTER — Telehealth: Payer: Self-pay | Admitting: *Deleted

## 2014-01-24 NOTE — Telephone Encounter (Signed)
Patient called stating she would like to drop off a one page form for Dr. Megan Salon to fill out to assist with prospective adoption; it is a physician's report. She is going to bring the form in on Monday 01/27/14. Advised her I would send a note to MD to see if he would be able to fill this out without scheduling an appointment. Explained that the MD is only here one morning next week.

## 2014-01-27 NOTE — Telephone Encounter (Signed)
I will fill out the form. She does not need a visit with me.

## 2014-01-28 NOTE — Telephone Encounter (Signed)
Form completed, and patient notified. Placed in folder upfront for patient to pick up. Bailey Morris

## 2014-01-29 ENCOUNTER — Other Ambulatory Visit: Payer: Self-pay

## 2014-01-29 DIAGNOSIS — Z1231 Encounter for screening mammogram for malignant neoplasm of breast: Secondary | ICD-10-CM

## 2014-02-04 DIAGNOSIS — J329 Chronic sinusitis, unspecified: Secondary | ICD-10-CM | POA: Insufficient documentation

## 2014-03-11 ENCOUNTER — Ambulatory Visit
Admission: RE | Admit: 2014-03-11 | Discharge: 2014-03-11 | Disposition: A | Discharge status: Home / Self Care | Payer: 59 | Source: Ambulatory Visit

## 2014-03-11 DIAGNOSIS — Z1231 Encounter for screening mammogram for malignant neoplasm of breast: Secondary | ICD-10-CM

## 2014-04-21 DIAGNOSIS — D219 Benign neoplasm of connective and other soft tissue, unspecified: Secondary | ICD-10-CM | POA: Insufficient documentation

## 2014-04-21 DIAGNOSIS — Z8619 Personal history of other infectious and parasitic diseases: Secondary | ICD-10-CM | POA: Insufficient documentation

## 2014-05-27 ENCOUNTER — Other Ambulatory Visit: Payer: 59

## 2014-05-27 DIAGNOSIS — B20 Human immunodeficiency virus [HIV] disease: Secondary | ICD-10-CM

## 2014-05-28 LAB — HIV-1 RNA QUANT-NO REFLEX-BLD: HIV-1 RNA Quant, Log: <1.3 {Log} (ref <1.30)

## 2014-05-28 LAB — T-HELPER CELL (CD4) - (RCID CLINIC ONLY)
CD4 % Helper T Cell: 37 % (ref 33–55)
CD4 T Cell Abs: 1000 /uL (ref 400–2700)

## 2014-06-10 ENCOUNTER — Ambulatory Visit (INDEPENDENT_AMBULATORY_CARE_PROVIDER_SITE_OTHER): Payer: 59 | Admitting: Internal Medicine

## 2014-06-10 ENCOUNTER — Encounter: Payer: Self-pay | Admitting: Internal Medicine

## 2014-06-10 VITALS — BP 156/97 | HR 90 | Temp 98.3°F | Wt 178.0 lb

## 2014-06-10 DIAGNOSIS — B20 Human immunodeficiency virus [HIV] disease: Secondary | ICD-10-CM

## 2014-06-10 DIAGNOSIS — Z21 Asymptomatic human immunodeficiency virus [HIV] infection status: Secondary | ICD-10-CM

## 2014-06-10 NOTE — Progress Notes (Signed)
Patient ID: Bailey Morris, female   DOB: October 10, 1970, 43 y.o.   MRN: [id number]          Patient Active Problem List   Diagnosis Date Noted  . HIV DISEASE 01/10/2007    Priority: High  . ASTHMA 08/10/2006    Priority: Medium  . PROTEINURIA 07/21/2009  . MYOMA 07/11/2007  . VENEREAL WART 08/10/2006  . DEPRESSION, MILD 08/10/2006  . MENORRHAGIA 08/10/2006  . SCOLIOSIS 08/10/2006  . SHINGLES, HX OF 08/10/2006    Patient's Medications  New Prescriptions   No medications on file  Previous Medications   BECLOMETHASONE (QVAR) 40 MCG/ACT INHALER    Inhale 2 puffs into the lungs 2 (two) times daily as needed. For wheezing   EFAVIRENZ-EMTRICITABINE-TENOFOVIR (ATRIPLA) 600-200-300 MG PER TABLET    Take 1 tablet by mouth at bedtime.   IBUPROFEN (ADVIL,MOTRIN) 800 MG TABLET    Take 1 tablet (800 mg total) by mouth every 8 (eight) hours as needed for pain.   LEVOCETIRIZINE (XYZAL) 5 MG TABLET    Take 5 mg by mouth every evening.   MONTELUKAST (SINGULAIR) 10 MG TABLET    Take 10 mg by mouth at bedtime.   Modified Medications   No medications on file  Discontinued Medications   No medications on file    Subjective: Bailey Morris is in for her routine visit. As usual she has not missed any doses of her Atripla. She is not sleeping well and states that her mood is unchanged. She admits that she is chronically depressed. Review of Systems: Pertinent items are noted in HPI.  Past Medical History  Diagnosis Date  . HIV positive   . PONV (postoperative nausea and vomiting)   . Asthma     History  Substance Use Topics  . Smoking status: Never Smoker   . Smokeless tobacco: Never Used  . Alcohol Use: 0.0 oz/week    0.1 drink(s) per week     Comment: socially    No family history on file.  Allergies  Allergen Reactions  . Sulfamethoxazole-Trimethoprim Other (See Comments)    REACTION: Severe Joint pain.  Marland Kitchen Fluticasone-Salmeterol Anxiety    Made jitteriness    Objective: Temp: 98.3  F (36.8 C) (12/22 0839) Temp Source: Oral (12/22 0839) BP: 156/97 mmHg (12/22 0839) Pulse Rate: 90 (12/22 0839) Body mass index is 29.62 kg/(m^2).  General: She is tearful Oral: No oropharyngeal lesions Skin: No rash Lungs: Clear Cor: Regular S1 and S2 with no murmur   Lab Results Lab Results  Component Value Date   WBC 6.7 11/26/2013   HGB 12.9 11/26/2013   HCT 38.5 11/26/2013   MCV 86.7 11/26/2013   PLT 319 11/26/2013    Lab Results  Component Value Date   CREATININE 0.95 11/26/2013   BUN 13 11/26/2013   NA 140 11/26/2013   K 4.4 11/26/2013   CL 105 11/26/2013   CO2 29 11/26/2013    Lab Results  Component Value Date   ALT 15 11/26/2013   AST 18 11/26/2013   ALKPHOS 63 11/26/2013   BILITOT 0.4 11/26/2013    Lab Results  Component Value Date   CHOL 140 11/26/2013   HDL 54 11/26/2013   LDLCALC 71 11/26/2013   TRIG 73 11/26/2013   CHOLHDL 2.6 11/26/2013    Lab Results HIV 1 RNA QUANT (copies/mL)  Date Value  05/27/2014 <20  11/26/2013 <20  05/29/2013 <20   CD4 T CELL ABS (/uL)  Date Value  05/27/2014 1000  11/26/2013 900  05/29/2013 940     Assessment: Her HIV infection remains under excellent control but she is chronically depressed. She's never been able to adjust to living with HIV infection. She is open to seeing one of our counselors. I suspect her insomnia is related to depression.  Plan: 1. Referral for mental health counseling  2. Continue Atripla 3. Follow-up in 6 weeks  Michel Bickers, MD [facility] for [place] [phone] pager   [phone] cell 06/10/2014, 8:56 AM

## 2014-06-16 ENCOUNTER — Ambulatory Visit: Payer: 59

## 2014-07-15 ENCOUNTER — Ambulatory Visit: Payer: 59

## 2014-07-22 ENCOUNTER — Encounter: Payer: Self-pay | Admitting: Internal Medicine

## 2014-07-22 ENCOUNTER — Ambulatory Visit (INDEPENDENT_AMBULATORY_CARE_PROVIDER_SITE_OTHER): Payer: 59 | Admitting: Internal Medicine

## 2014-07-22 ENCOUNTER — Ambulatory Visit: Payer: 59

## 2014-07-22 VITALS — BP 123/84 | HR 83 | Temp 98.1°F | Wt 177.2 lb

## 2014-07-22 DIAGNOSIS — Z21 Asymptomatic human immunodeficiency virus [HIV] infection status: Secondary | ICD-10-CM

## 2014-07-22 DIAGNOSIS — F32A Depression, unspecified: Secondary | ICD-10-CM

## 2014-07-22 DIAGNOSIS — F329 Major depressive disorder, single episode, unspecified: Secondary | ICD-10-CM

## 2014-07-22 DIAGNOSIS — B20 Human immunodeficiency virus [HIV] disease: Secondary | ICD-10-CM

## 2014-07-22 DIAGNOSIS — F4323 Adjustment disorder with mixed anxiety and depressed mood: Secondary | ICD-10-CM

## 2014-07-22 NOTE — Progress Notes (Signed)
Patient ID: Bailey Morris, female   DOB: [birth year], 44 y.o.   MRN: [id number]          Patient Active Problem List   Diagnosis Date Noted  . HIV DISEASE 01/10/2007    Priority: High  . ASTHMA 08/10/2006    Priority: Medium  . PROTEINURIA 07/21/2009  . MYOMA 07/11/2007  . VENEREAL WART 08/10/2006  . DEPRESSION, MILD 08/10/2006  . MENORRHAGIA 08/10/2006  . SCOLIOSIS 08/10/2006  . SHINGLES, HX OF 08/10/2006    Patient's Medications  New Prescriptions   No medications on file  Previous Medications   BECLOMETHASONE (QVAR) 40 MCG/ACT INHALER    Inhale 2 puffs into the lungs 2 (two) times daily as needed. For wheezing   EFAVIRENZ-EMTRICITABINE-TENOFOVIR (ATRIPLA) 600-200-300 MG PER TABLET    Take 1 tablet by mouth at bedtime.   IBUPROFEN (ADVIL,MOTRIN) 800 MG TABLET    Take 1 tablet (800 mg total) by mouth every 8 (eight) hours as needed for pain.   LEVOCETIRIZINE (XYZAL) 5 MG TABLET    Take 5 mg by mouth every evening.   MONTELUKAST (SINGULAIR) 10 MG TABLET    Take 10 mg by mouth at bedtime.   Modified Medications   No medications on file  Discontinued Medications   No medications on file    Subjective: Bailey Morris is in for her follow-up visit to discuss her chronic depression. She is struggling with her manager that she does not relate well to. Her manager is not capable of mentor during her and helping her develop in her job. She does not want to look for another job. The process of trying to adopt a child is also adding stress. She did meet with our counselor, Curley Spice, after her visit with me in December and she is due to see him again today. As usual she never misses a single dose of her Atripla. Review of Systems: Pertinent items are noted in HPI.  Past Medical History  Diagnosis Date  . HIV positive   . PONV (postoperative nausea and vomiting)   . Asthma     History  Substance Use Topics  . Smoking status: Never Smoker   . Smokeless tobacco: Never Used  .  Alcohol Use: 0.0 oz/week    0.1 drink(s) per week     Comment: socially    No family history on file.  Allergies  Allergen Reactions  . Sulfa Antibiotics   . Sulfamethoxazole-Trimethoprim Other (See Comments)    REACTION: Severe Joint pain.  Marland Kitchen Fluticasone-Salmeterol Anxiety    Made jitteriness Made jitteriness    Objective: Temp: 98.1 F (36.7 C) (02/02 0858) BP: 123/84 mmHg (02/02 0858) Pulse Rate: 83 (02/02 0858) Body mass index is 29.5 kg/(m^2).  General: She is tearful Oral: No oropharyngeal lesions Skin: No rash Lungs: Clear Cor: Regular S1 and S2 with no murmur   Lab Results Lab Results  Component Value Date   WBC 6.7 11/26/2013   HGB 12.9 11/26/2013   HCT 38.5 11/26/2013   MCV 86.7 11/26/2013   PLT 319 11/26/2013    Lab Results  Component Value Date   CREATININE 0.95 11/26/2013   BUN 13 11/26/2013   NA 140 11/26/2013   K 4.4 11/26/2013   CL 105 11/26/2013   CO2 29 11/26/2013    Lab Results  Component Value Date   ALT 15 11/26/2013   AST 18 11/26/2013   ALKPHOS 63 11/26/2013   BILITOT 0.4 11/26/2013    Lab Results  Component Value  Date   CHOL 140 11/26/2013   HDL 54 11/26/2013   LDLCALC 71 11/26/2013   TRIG 73 11/26/2013   CHOLHDL 2.6 11/26/2013    Lab Results HIV 1 RNA QUANT (copies/mL)  Date Value  05/27/2014 <20  11/26/2013 <20  05/29/2013 <20   CD4 T CELL ABS (/uL)  Date Value  05/27/2014 1000  11/26/2013 900  05/29/2013 940     Assessment: Her HIV infection remains under excellent control but she is chronically depressed. I encouraged her to continue to see our counselor.  Plan: 1. Continue mental health counseling  2. Continue Atripla 3. Follow-up in 6 months  Michel Bickers, MD [facility] for [place] [phone] pager   [phone] cell 07/22/2014, 9:16 AM

## 2014-07-22 NOTE — BH Specialist Note (Signed)
I met with Bailey Morris today for the second time and she talked about her frustration over not being able to have a child, over not being able to find a man worth having a relationship with, and her frustration with the adoption process.  She was tearful briefly as she talked about this.  She seems somewhat guarded about discussing this with me and chose not to reschedule, but did take my card and agreed to think about it and call back to reschedule if she felt she needed to.   Curley Spice, LCSW  [place]: [phone]

## 2014-10-07 NOTE — Consult Note (Signed)
Referral Information:   Reason for Referral Prepregnancy consult - patient considering pregnancy and is HIV positive    Referring Physician [facility]    Prenatal Hx 44 year old G14 with LMP of 9/14/20123 who is planning Clomid ovarian stimulation and IUI. The patient is HIV positive. She has had the diagnosis for approximately 13 years. She is on stable antiviral medications. Her recent viral loads have been "undectable" and "less than 20". Her recent CD4 counts have been > 700.   The patient also has a history of fibroid uterus. She is status post laparotomy and myomectomy in 2009. She thinks she had 5 fibroids removed. She had the procedure secondary to the fibroids causing severe pain and heavy menses. She thinks that some of the fibroids are returning. She does not know if her endometrial cavity was entered during her myomectomy.  The patient also suffers from severe seasonal allergies. For this she takes daily Singulair and Xyazl. Her other allergy medications are only PRN.   Home Medications: Medication Instructions Status  Atripla oral tablet 1 tab(s) orally once a day (at bedtime) Active  Xyzal 5 mg oral tablet 1 tab(s) orally once a day (in the evening) Active  Singulair 10 mg oral tablet 1 tab(s) orally once a day (in the evening) Active  ProAir HFA 2 puff(s) orally 2 times a day, As Needed- for Wheezing , for Shortness of Breath  Active  Omnaris 50 mcg/inh nasal spray 2 spray(s) nasal once a day, As Needed- for Wheezing , for Shortness of Breath  Active  Bepreve 1.5% ophthalmic solution 1 drop(s) to each affected eye 2 times a day, As Needed, itchy eyes Active  Prenatal Multivitamins oral tablet 1 tab(s) orally once a day Active  Qvar 40 mcg/inh inhalation aerosol 2 puff(s) inhaled 2 times a day PRN Active   Allergies:   Septra: Resp. Distress  Advair Diskus: Agitation, Tachycardia, Anxiety  Vital Signs/Notes:  Nursing Vital Signs: **Vital Signs.:   26-Sep-13 13:23   Vital Signs  Type Routine   Temperature Temperature (F) 97.8   Celsius 36.5   Temperature Source oral   Pulse Pulse 72   Pulse source if not from Vital Sign Device dinamap   Respirations Respirations 14   Systolic BP Systolic BP 767   Diastolic BP (mmHg) Diastolic BP (mmHg) 83   Mean BP 99   BP Source  if not from Vital Sign Device dinamap   Perinatal Consult:   LMP 03-Mar-2012    Past Medical History cont'd Gyn Hx - as noted above   HPV + Pap within last 2 mos.   No H/O dysplasia   S/P laser ablation of condylomata approx. 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   Impression/Recommendations:   Impression 1) Patient planning pregnancy 2) HIV positive   a) On stable currently effective therapy   b) No contraindication to pregnancy 3) Fibroid uterus   a) Question of recurrence   b) Unknown if myomectomy violated  endometrium   c) Fibroids may grow with future pregnancy 4) Older maternal age   a) Higher risk for aneuploidy   b) Higher risk for spontaneous abortion   c) Higher risk for diseases of middle age arising and possibly complicating pregnancy   d) Higher risk for subfertility    Recommendations 1) Proceed with Clomid ovarian stimulation and IUI if patient desires 2) Patient may continue all of her medications now and through future pregnancy 3) With future pregnancy, co-manage patient in conjuction with Infectious Disease and Maternal-Fetal Medicine 4) Assuming patients HIV status remains  stable through pregnancy, mode of delivery would be secondary to obstetrical concerns 5) Once pregnant, obtain operative report to assess safety of a trial of labor 6) Administer influenza vaccine 7) Patient should be offered genetic counseling and/or prenatal screening/diagnosis once she is pregnant     Total Time Spent with Patient 30 minutes    >50% of visit spent in couseling/coordination of care yes    Office Use Only 99242  Level 2 (73min) NEW office consult exp prob focused   Coding Description: OTHER: HIV positive, Older maternal age, Fibroid uterus.  Electronic Signatures: Sande Rives (MD)  (Signed 26-Sep-13 14:39)  Authored: Referral, Home Medications, Allergies, Vital Signs/Notes, Consult, Impression, Billing, Coding Description   Last Updated: 26-Sep-13 14:39 by Sande Rives (MD)

## 2014-12-29 ENCOUNTER — Other Ambulatory Visit: Payer: Self-pay | Admitting: Internal Medicine

## 2015-01-06 ENCOUNTER — Other Ambulatory Visit: Payer: 59

## 2015-01-06 ENCOUNTER — Other Ambulatory Visit: Payer: Self-pay | Admitting: Internal Medicine

## 2015-01-06 DIAGNOSIS — Z79899 Other long term (current) drug therapy: Secondary | ICD-10-CM

## 2015-01-06 DIAGNOSIS — B20 Human immunodeficiency virus [HIV] disease: Secondary | ICD-10-CM

## 2015-01-06 DIAGNOSIS — Z113 Encounter for screening for infections with a predominantly sexual mode of transmission: Secondary | ICD-10-CM

## 2015-01-07 ENCOUNTER — Other Ambulatory Visit: Payer: 59

## 2015-01-07 DIAGNOSIS — B20 Human immunodeficiency virus [HIV] disease: Secondary | ICD-10-CM

## 2015-01-07 DIAGNOSIS — Z79899 Other long term (current) drug therapy: Secondary | ICD-10-CM

## 2015-01-07 LAB — CBC WITH DIFFERENTIAL/PLATELET
BASOS PCT: 0 % (ref 0–1)
Basophils Absolute: 0 10*3/uL (ref 0.0–0.1)
EOS ABS: 0.1 10*3/uL (ref 0.0–0.7)
Eosinophils Relative: 2 % (ref 0–5)
HCT: 36.7 % (ref 36.0–46.0)
Hemoglobin: 12.2 g/dL (ref 12.0–15.0)
LYMPHS ABS: 2.3 10*3/uL (ref 0.7–4.0)
Lymphocytes Relative: 49 % — ABNORMAL HIGH (ref 12–46)
MCH: 28 pg (ref 26.0–34.0)
MCHC: 33.2 g/dL (ref 30.0–36.0)
MCV: 84.2 fL (ref 78.0–100.0)
MONO ABS: 0.3 10*3/uL (ref 0.1–1.0)
MONOS PCT: 6 % (ref 3–12)
MPV: 9.6 fL (ref 8.6–12.4)
NEUTROS PCT: 43 % (ref 43–77)
Neutro Abs: 2 10*3/uL (ref 1.7–7.7)
Platelets: 350 10*3/uL (ref 150–400)
RBC: 4.36 MIL/uL (ref 3.87–5.11)
RDW: 14.7 % (ref 11.5–15.5)
WBC: 4.6 10*3/uL (ref 4.0–10.5)

## 2015-01-07 NOTE — Addendum Note (Signed)
Addended by: Dolan Amen D on: 01/07/2015 03:21 PM   Modules accepted: Orders

## 2015-01-08 LAB — T-HELPER CELL (CD4) - (RCID CLINIC ONLY)
CD4 T CELL ABS: 850 /uL (ref 400–2700)
CD4 T CELL HELPER: 40 % (ref 33–55)

## 2015-01-08 LAB — LIPID PANEL
CHOL/HDL RATIO: 2.4 ratio
CHOLESTEROL: 152 mg/dL (ref 0–200)
HDL: 64 mg/dL (ref >=46)
LDL CALC: 78 mg/dL (ref 0–99)
Triglycerides: 49 mg/dL (ref <150)
VLDL: 10 mg/dL (ref 0–40)

## 2015-01-08 LAB — COMPREHENSIVE METABOLIC PANEL
ALT: 16 U/L (ref 0–35)
AST: 20 U/L (ref 0–37)
Albumin: 4.2 g/dL (ref 3.5–5.2)
Alkaline Phosphatase: 64 U/L (ref 39–117)
BILIRUBIN TOTAL: 0.3 mg/dL (ref 0.2–1.2)
BUN: 13 mg/dL (ref 6–23)
CO2: 24 mEq/L (ref 19–32)
CREATININE: 0.89 mg/dL (ref 0.50–1.10)
Calcium: 9.4 mg/dL (ref 8.4–10.5)
Chloride: 103 mEq/L (ref 96–112)
GLUCOSE: 89 mg/dL (ref 70–99)
Potassium: 4.7 mEq/L (ref 3.5–5.3)
Sodium: 140 mEq/L (ref 135–145)
TOTAL PROTEIN: 7.4 g/dL (ref 6.0–8.3)

## 2015-01-08 LAB — RPR

## 2015-01-08 LAB — URINE CYTOLOGY ANCILLARY ONLY
CHLAMYDIA, DNA PROBE: NEGATIVE
Neisseria Gonorrhea: NEGATIVE

## 2015-01-09 LAB — HIV-1 RNA QUANT-NO REFLEX-BLD
HIV 1 RNA Quant: <20 copies/mL (ref <20)
HIV-1 RNA Quant, Log: <1.3 {Log} (ref <1.30)

## 2015-01-20 ENCOUNTER — Ambulatory Visit (INDEPENDENT_AMBULATORY_CARE_PROVIDER_SITE_OTHER): Payer: 59 | Admitting: Internal Medicine

## 2015-01-20 DIAGNOSIS — B20 Human immunodeficiency virus [HIV] disease: Secondary | ICD-10-CM

## 2015-01-20 NOTE — Progress Notes (Addendum)
Patient ID: Bailey Morris, female   DOB: [birth year], 44 y.o.   MRN: [id number]          Patient Active Problem List   Diagnosis Date Noted  . Human immunodeficiency virus (HIV) disease 01/10/2007    Priority: High  . ASTHMA 08/10/2006    Priority: Medium  . PROTEINURIA 07/21/2009  . MYOMA 07/11/2007  . VENEREAL WART 08/10/2006  . DEPRESSION, MILD 08/10/2006  . MENORRHAGIA 08/10/2006  . SCOLIOSIS 08/10/2006  . SHINGLES, HX OF 08/10/2006    Patient's Medications  New Prescriptions   No medications on file  Previous Medications   ATRIPLA 600-200-300 MG PER TABLET    Take 1 tablet by mouth at  bedtime   BECLOMETHASONE (QVAR) 40 MCG/ACT INHALER    Inhale 2 puffs into the lungs 2 (two) times daily as needed. For wheezing   IBUPROFEN (ADVIL,MOTRIN) 800 MG TABLET    Take 1 tablet (800 mg total) by mouth every 8 (eight) hours as needed for pain.   LEVOCETIRIZINE (XYZAL) 5 MG TABLET    Take 5 mg by mouth every evening.   MONTELUKAST (SINGULAIR) 10 MG TABLET    Take 10 mg by mouth at bedtime.   Modified Medications   No medications on file  Discontinued Medications   No medications on file    Subjective: Bailey Morris is in for her routine HIV follow-up visit. She continues on her Atripla and has not missed doses. She takes it in the evening and does note that she gets very sleepy just before bedtime. She does not wake up at night. She is not bothered by crazy dreams.  She is still frustrated with her boss at work but has learned to deal with it stating "it is what it is". She denies feeling depressed at this time.  She continues to go to the process for possible adoption. She has been pared with a woman who is pregnant in [place]. That woman has an expected due date of November 5. She is pregnant with a baby boy.  Review of Systems: Constitutional: negative Eyes: negative Ears, nose, mouth, throat, and face: negative Respiratory: negative Cardiovascular: negative Gastrointestinal:  negative Genitourinary:negative  Past Medical History  Diagnosis Date  . HIV positive   . PONV (postoperative nausea and vomiting)   . Asthma     History  Substance Use Topics  . Smoking status: Never Smoker   . Smokeless tobacco: Never Used  . Alcohol Use: 0.0 oz/week    0.1 drink(s) per week     Comment: socially    No family history on file.  Allergies  Allergen Reactions  . Sulfa Antibiotics   . Sulfamethoxazole-Trimethoprim Other (See Comments)    REACTION: Severe Joint pain.  Marland Kitchen Fluticasone-Salmeterol Anxiety    Made jitteriness Made jitteriness    Objective: Temp: 98.1 F (36.7 C) (08/02 1630) Temp Source: Oral (08/02 1630) BP: 130/92 mmHg (08/02 1630) Pulse Rate: 82 (08/02 1630) Body mass index is 28.79 kg/(m^2).  General: Her weight is down 4 pounds to 173 Oral: No oropharyngeal lesions Skin: No rash Lungs: Clear Cor: Regular S1 and S2 with no murmurs Abdomen: Soft and nontender Joints and extremities: Normal Neuro: Alert with normal speech and conversation. Normal gait Mood: Normal and appropriate. She does not appear anxious or depressed  Lab Results Lab Results  Component Value Date   WBC 4.6 01/07/2015   HGB 12.2 01/07/2015   HCT 36.7 01/07/2015   MCV 84.2 01/07/2015   PLT 350 01/07/2015  Lab Results  Component Value Date   CREATININE 0.89 01/07/2015   BUN 13 01/07/2015   NA 140 01/07/2015   K 4.7 01/07/2015   CL 103 01/07/2015   CO2 24 01/07/2015    Lab Results  Component Value Date   ALT 16 01/07/2015   AST 20 01/07/2015   ALKPHOS 64 01/07/2015   BILITOT 0.3 01/07/2015    Lab Results  Component Value Date   CHOL 152 01/07/2015   HDL 64 01/07/2015   LDLCALC 78 01/07/2015   TRIG 49 01/07/2015   CHOLHDL 2.4 01/07/2015    Lab Results HIV 1 RNA QUANT (copies/mL)  Date Value  01/07/2015 <20  05/27/2014 <20  11/26/2013 <20   CD4 T CELL ABS (/uL)  Date Value  01/07/2015 850  05/27/2014 1000  11/26/2013 900       Assessment: Her HIV infection remains under excellent control. She is expressing appropriate concerns about being excessively sleepy at night when and if she becomes a new mom later this year. I discussed other one pill a day options with her. She has changed her eating habits in the past year in an attempt to lose weight and so eats meals sporadically. She is not on a set schedule and thinks it would be difficult to take a pill each day at roughly the same time with a meal. I talked her about the option of [name] once daily at bedtime. I will screen her for hypersensitivity reaction to abacavir.  She continues to be frustrated with her boss but does not appear depressed at this time. She acts he seems much happier and I think that the adoption process has been a good distraction. I asked her to call us at any time if she starts to feel depressed again.  I encouraged her on her weight loss.  Plan: 1. Check HLA [id number] and change Atripla to [name] if it is negative 2. Follow-up after blood work in [place] months   Michel Bickers, MD [facility] for [place] [phone] pager   [phone] cell 01/20/2015, 4:52 PM  Addendum:  HLA [id number]: Negative  I will change Atripla to [name] once daily.  Michel Bickers, MD [facility] for [facility] [place] [facility] [phone] pager   [phone] cell 01/30/2015, 9:00 AM

## 2015-01-27 LAB — HLA B*5701: HLA-B 5701 W/RFLX HLA-B HIGH: NEGATIVE

## 2015-01-30 MED ORDER — ABACAVIR-DOLUTEGRAVIR-LAMIVUD 600-50-300 MG PO TABS: 1.0000 | ORAL_TABLET | Freq: Every day | ORAL | Status: DC

## 2015-01-30 NOTE — Addendum Note (Signed)
Addended by: Michel Bickers on: 01/30/2015 09:01 AM   Modules accepted: Orders, Medications

## 2015-02-18 ENCOUNTER — Other Ambulatory Visit: Payer: Self-pay

## 2015-02-18 DIAGNOSIS — Z1231 Encounter for screening mammogram for malignant neoplasm of breast: Secondary | ICD-10-CM

## 2015-03-11 LAB — PROCEDURE REPORT - SCANNED: Pap: NEGATIVE

## 2015-03-26 ENCOUNTER — Ambulatory Visit
Admission: RE | Admit: 2015-03-26 | Discharge: 2015-03-26 | Disposition: A | Discharge status: Home / Self Care | Payer: 59 | Source: Ambulatory Visit

## 2015-03-26 DIAGNOSIS — Z1231 Encounter for screening mammogram for malignant neoplasm of breast: Secondary | ICD-10-CM

## 2015-07-22 ENCOUNTER — Ambulatory Visit: Payer: 59 | Admitting: *Deleted

## 2015-07-22 ENCOUNTER — Other Ambulatory Visit: Payer: 59

## 2015-07-22 DIAGNOSIS — B20 Human immunodeficiency virus [HIV] disease: Secondary | ICD-10-CM

## 2015-07-22 DIAGNOSIS — F32A Depression, unspecified: Secondary | ICD-10-CM

## 2015-07-22 DIAGNOSIS — F329 Major depressive disorder, single episode, unspecified: Secondary | ICD-10-CM

## 2015-07-22 LAB — CBC
HEMATOCRIT: 33.2 % — AB (ref 36.0–46.0)
Hemoglobin: 10.3 g/dL — ABNORMAL LOW (ref 12.0–15.0)
MCH: 24.5 pg — ABNORMAL LOW (ref 26.0–34.0)
MCHC: 31 g/dL (ref 30.0–36.0)
MCV: 78.9 fL (ref 78.0–100.0)
MPV: 10 fL (ref 8.6–12.4)
Platelets: 413 10*3/uL — ABNORMAL HIGH (ref 150–400)
RBC: 4.21 MIL/uL (ref 3.87–5.11)
RDW: 14.9 % (ref 11.5–15.5)
WBC: 6.7 10*3/uL (ref 4.0–10.5)

## 2015-07-22 LAB — COMPREHENSIVE METABOLIC PANEL
ALBUMIN: 4.2 g/dL (ref 3.6–5.1)
ALT: 12 U/L (ref 6–29)
AST: 18 U/L (ref 10–30)
Alkaline Phosphatase: 71 U/L (ref 33–115)
BILIRUBIN TOTAL: 0.4 mg/dL (ref 0.2–1.2)
BUN: 14 mg/dL (ref 7–25)
CHLORIDE: 103 mmol/L (ref 98–110)
CO2: 28 mmol/L (ref 20–31)
Calcium: 9.4 mg/dL (ref 8.6–10.2)
Creat: 1.18 mg/dL — ABNORMAL HIGH (ref 0.50–1.10)
Glucose, Bld: 94 mg/dL (ref 65–99)
Potassium: 4.1 mmol/L (ref 3.5–5.3)
Sodium: 138 mmol/L (ref 135–146)
TOTAL PROTEIN: 7.5 g/dL (ref 6.1–8.1)

## 2015-07-22 LAB — LIPID PANEL
CHOLESTEROL: 177 mg/dL (ref 125–200)
HDL: 83 mg/dL (ref >=46)
LDL CALC: 79 mg/dL (ref <130)
TRIGLYCERIDES: 73 mg/dL (ref <150)
Total CHOL/HDL Ratio: 2.1 Ratio (ref <=5.0)
VLDL: 15 mg/dL (ref <30)

## 2015-07-22 NOTE — BH Specialist Note (Signed)
Counselor met with Bailey Morris today. Counselor inquired with patient how her depression was.  Patient was oriented times four with good affect and dress.  Patient was alert and talkative.  Patient shared that she was doing pretty good and had come to terms with not being able to have a baby and her romantic relationship not working out. Patient shared that the adoption process was complete and she received her new baby last November 2016.  Patient states that she is a busy mom now but that her life is filled with the puzzle piece she needed to find.  Counselor provided support and encouragement accordingly.  Counselor gave patient a business card and encouraged her to call if she ever needed anything. Patient stated she would definitely do so.   Rolena Infante, MA [facility]

## 2015-07-23 LAB — HIV-1 RNA QUANT-NO REFLEX-BLD
HIV 1 RNA Quant: <20 copies/mL (ref <20)
HIV-1 RNA Quant, Log: <1.3 Log copies/mL (ref <1.30)

## 2015-07-23 LAB — T-HELPER CELL (CD4) - (RCID CLINIC ONLY)
CD4 T CELL ABS: 1060 /uL (ref 400–2700)
CD4 T CELL HELPER: 44 % (ref 33–55)

## 2015-07-23 LAB — RPR

## 2015-08-06 ENCOUNTER — Encounter: Payer: Self-pay | Admitting: Internal Medicine

## 2015-08-06 ENCOUNTER — Ambulatory Visit (INDEPENDENT_AMBULATORY_CARE_PROVIDER_SITE_OTHER): Payer: 59 | Admitting: Internal Medicine

## 2015-08-06 VITALS — Temp 98.0°F | Wt 183.0 lb

## 2015-08-06 DIAGNOSIS — B20 Human immunodeficiency virus [HIV] disease: Secondary | ICD-10-CM

## 2015-08-06 DIAGNOSIS — D509 Iron deficiency anemia, unspecified: Secondary | ICD-10-CM | POA: Diagnosis not present

## 2015-08-06 NOTE — Progress Notes (Signed)
Patient Active Problem List   Diagnosis Date Noted  . Human immunodeficiency virus (HIV) disease ([place]) 01/10/2007    Priority: High  . Iron deficiency anemia 08/06/2015    Priority: Medium  . ASTHMA 08/10/2006    Priority: Medium  . PROTEINURIA 07/21/2009  . MYOMA 07/11/2007  . VENEREAL WART 08/10/2006  . DEPRESSION, MILD 08/10/2006  . MENORRHAGIA 08/10/2006  . SCOLIOSIS 08/10/2006  . SHINGLES, HX OF 08/10/2006    Patient's Medications  New Prescriptions   No medications on file  Previous Medications   ABACAVIR-DOLUTEGRAVIR-LAMIVUD 600-50-300 MG TABS    Take 1 tablet by mouth daily.   BECLOMETHASONE (QVAR) 40 MCG/ACT INHALER    Inhale 2 puffs into the lungs 2 (two) times daily as needed. For wheezing   IBUPROFEN (ADVIL,MOTRIN) 800 MG TABLET    Take 1 tablet (800 mg total) by mouth every 8 (eight) hours as needed for pain.   LEVOCETIRIZINE (XYZAL) 5 MG TABLET    Take 5 mg by mouth every evening.   MONTELUKAST (SINGULAIR) 10 MG TABLET    Take 10 mg by mouth at bedtime.   Modified Medications   No medications on file  Discontinued Medications   No medications on file    Subjective: Bailey Morris is in for her routine HIV follow-up. She has her new daughter, Bailey Morris, with her. She is doing well other than being tired as expected for a new mom. She has not missed any doses of her [name] and she is tolerating it well.  Review of Systems: Review of Systems  Constitutional: Negative for fever, chills, weight loss, malaise/fatigue and diaphoresis.  HENT: Negative for sore throat.   Respiratory: Negative for cough, sputum production and shortness of breath.   Cardiovascular: Negative for chest pain.  Gastrointestinal: Negative for nausea, vomiting and diarrhea.  Genitourinary: Negative for dysuria.  Musculoskeletal: Negative for joint pain.  Skin: Negative for rash.  Neurological: Negative for headaches.  Psychiatric/Behavioral: Negative for depression.    Past  Medical History  Diagnosis Date  . HIV positive ([place])   . PONV (postoperative nausea and vomiting)   . Asthma     Social History  Substance Use Topics  . Smoking status: Never Smoker   . Smokeless tobacco: Never Used  . Alcohol Use: 0.0 oz/week    0.1 drink(s) per week     Comment: socially    No family history on file.  Allergies  Allergen Reactions  . Sulfa Antibiotics   . Sulfamethoxazole-Trimethoprim Other (See Comments)    REACTION: Severe Joint pain.  Marland Kitchen Fluticasone-Salmeterol Anxiety    Made jitteriness Made jitteriness    Objective:  Filed Vitals:   08/06/15 0849  Temp: 98 F (36.7 C)  TempSrc: Oral  Weight: 183 lb (83.008 kg)   Body mass index is 30.45 kg/(m^2).  Physical Exam  Constitutional: She is oriented to person, place, and time.  She is smiling and in very good spirits.  HENT:  Mouth/Throat: No oropharyngeal exudate.  Eyes: Conjunctivae are normal.  Cardiovascular: Normal rate and regular rhythm.   No murmur heard. Pulmonary/Chest: Breath sounds normal.  Abdominal: Soft. There is no tenderness.  Musculoskeletal: Normal range of motion.  Neurological: She is alert and oriented to person, place, and time.  Skin: No rash noted.  Psychiatric: Mood and affect normal.    Lab Results Lab Results  Component Value Date   WBC 6.7 07/22/2015   HGB 10.3* 07/22/2015   HCT  33.2* 07/22/2015   MCV 78.9 07/22/2015   PLT 413* 07/22/2015    Lab Results  Component Value Date   CREATININE 1.18* 07/22/2015   BUN 14 07/22/2015   NA 138 07/22/2015   K 4.1 07/22/2015   CL 103 07/22/2015   CO2 28 07/22/2015    Lab Results  Component Value Date   ALT 12 07/22/2015   AST 18 07/22/2015   ALKPHOS 71 07/22/2015   BILITOT 0.4 07/22/2015    Lab Results  Component Value Date   CHOL 177 07/22/2015   HDL 83 07/22/2015   LDLCALC 79 07/22/2015   TRIG 73 07/22/2015   CHOLHDL 2.1 07/22/2015    Lab Results HIV 1 RNA QUANT (copies/mL)  Date Value    07/22/2015 <20  01/07/2015 <20  05/27/2014 <20   CD4 T CELL ABS (/uL)  Date Value  07/22/2015 1060  01/07/2015 850  05/27/2014 1000      Problem List Items Addressed This Visit      High   Human immunodeficiency virus (HIV) disease ([place])    Her HIV infection remains under excellent control. She will continue [name] and follow-up after lab work in 6 months.      Relevant Orders   T-helper cell (CD4)- ([facility] clinic only)   HIV 1 RNA quant-no reflex-bld   CBC   Comprehensive metabolic panel     Medium   Iron deficiency anemia - Primary    Her hemoglobin and MCV have fallen again. She stopped taking iron supplements about 2 years ago. I suspect that she has developed recurrent iron deficiency anemia due to menstrual blood loss. She will restart her iron supplementation now.           Michel Bickers, MD [facility] for [facility] [place] [facility] [phone] pager   [phone] cell 08/06/2015, 9:08 AM

## 2015-08-06 NOTE — Assessment & Plan Note (Signed)
Her hemoglobin and MCV have fallen again. She stopped taking iron supplements about 2 years ago. I suspect that she has developed recurrent iron deficiency anemia due to menstrual blood loss. She will restart her iron supplementation now.

## 2015-08-06 NOTE — Assessment & Plan Note (Signed)
Her HIV infection remains under excellent control. She will continue [name] and follow-up after lab work in 6 months.

## 2016-01-26 ENCOUNTER — Encounter: Payer: Self-pay | Admitting: Internal Medicine

## 2016-01-26 ENCOUNTER — Ambulatory Visit (INDEPENDENT_AMBULATORY_CARE_PROVIDER_SITE_OTHER): Payer: 59 | Admitting: Internal Medicine

## 2016-01-26 DIAGNOSIS — B20 Human immunodeficiency virus [HIV] disease: Secondary | ICD-10-CM | POA: Diagnosis not present

## 2016-01-26 DIAGNOSIS — D509 Iron deficiency anemia, unspecified: Secondary | ICD-10-CM | POA: Diagnosis not present

## 2016-01-26 LAB — CBC
HCT: 41.2 % (ref 35.0–45.0)
Hemoglobin: 13.9 g/dL (ref 11.7–15.5)
MCH: 30.8 pg (ref 27.0–33.0)
MCHC: 33.7 g/dL (ref 32.0–36.0)
MCV: 91.2 fL (ref 80.0–100.0)
MPV: 10 fL (ref 7.5–12.5)
PLATELETS: 316 10*3/uL (ref 140–400)
RBC: 4.52 MIL/uL (ref 3.80–5.10)
RDW: 13.4 % (ref 11.0–15.0)
WBC: 6.7 10*3/uL (ref 3.8–10.8)

## 2016-01-26 LAB — COMPREHENSIVE METABOLIC PANEL
ALBUMIN: 4.2 g/dL (ref 3.6–5.1)
ALT: 13 U/L (ref 6–29)
AST: 14 U/L (ref 10–30)
Alkaline Phosphatase: 60 U/L (ref 33–115)
BILIRUBIN TOTAL: 0.5 mg/dL (ref 0.2–1.2)
BUN: 15 mg/dL (ref 7–25)
CALCIUM: 9.5 mg/dL (ref 8.6–10.2)
CO2: 25 mmol/L (ref 20–31)
Chloride: 100 mmol/L (ref 98–110)
Creat: 1.05 mg/dL (ref 0.50–1.10)
Glucose, Bld: 83 mg/dL (ref 65–99)
Potassium: 4.4 mmol/L (ref 3.5–5.3)
Sodium: 138 mmol/L (ref 135–146)
Total Protein: 7.5 g/dL (ref 6.1–8.1)

## 2016-01-26 NOTE — Progress Notes (Signed)
Patient Active Problem List   Diagnosis Date Noted  . Human immunodeficiency virus (HIV) disease ([place]) 01/10/2007    Priority: High  . Iron deficiency anemia 08/06/2015    Priority: Medium  . ASTHMA 08/10/2006    Priority: Medium  . PROTEINURIA 07/21/2009  . MYOMA 07/11/2007  . VENEREAL WART 08/10/2006  . DEPRESSION, MILD 08/10/2006  . MENORRHAGIA 08/10/2006  . SCOLIOSIS 08/10/2006  . SHINGLES, HX OF 08/10/2006    Patient's Medications  New Prescriptions   No medications on file  Previous Medications   ABACAVIR-DOLUTEGRAVIR-LAMIVUD 600-50-300 MG TABS    Take 1 tablet by mouth daily.   BECLOMETHASONE (QVAR) 40 MCG/ACT INHALER    Inhale 2 puffs into the lungs 2 (two) times daily as needed. For wheezing   IBUPROFEN (ADVIL,MOTRIN) 800 MG TABLET    Take 1 tablet (800 mg total) by mouth every 8 (eight) hours as needed for pain.   LEVOCETIRIZINE (XYZAL) 5 MG TABLET    Take 5 mg by mouth every evening.   MONTELUKAST (SINGULAIR) 10 MG TABLET    Take 10 mg by mouth at bedtime.   Modified Medications   No medications on file  Discontinued Medications   No medications on file    Subjective: Bailey Morris is in for her routine HIV follow-up visit. She estimates that she's missed 4 doses of her [name] since her last visit. She attributes this to being a busy new mom. Her daughter, Bailey Morris, is now 41 months old. She did restart taking her iron after her last visit. She takes her medications together each morning.   Review of Systems: Review of Systems  Constitutional: Negative for chills, diaphoresis, fever, malaise/fatigue and weight loss.  HENT: Negative for sore throat.   Respiratory: Negative for cough, sputum production and shortness of breath.   Cardiovascular: Negative for chest pain.  Gastrointestinal: Negative for abdominal pain, diarrhea, nausea and vomiting.  Genitourinary: Negative for dysuria and frequency.  Musculoskeletal: Negative for joint pain and myalgias.    Skin: Negative for rash.  Neurological: Negative for dizziness and headaches.  Psychiatric/Behavioral: Negative for depression and substance abuse. The patient is not nervous/anxious.     Past Medical History:  Diagnosis Date  . Asthma   . HIV positive ([place])   . PONV (postoperative nausea and vomiting)     Social History  Substance Use Topics  . Smoking status: Never Smoker  . Smokeless tobacco: Never Used  . Alcohol use 0.0 oz/week    0 drink(s) per week     Comment: socially    No family history on file.  Allergies  Allergen Reactions  . Sulfa Antibiotics   . Sulfamethoxazole-Trimethoprim Other (See Comments)    REACTION: Severe Joint pain.  Marland Kitchen Fluticasone-Salmeterol Anxiety    Made jitteriness Made jitteriness    Objective:  Vitals:   01/26/16 1109  BP: (!) 137/94  Pulse: 89  Temp: 97.8 F (36.6 C)  TempSrc: Oral  Weight: 191 lb (86.6 kg)   Body mass index is 31.78 kg/m.  Physical Exam  Constitutional: She is oriented to person, place, and time.  She is in good spirits. She happily shows me a cell phone pictures of her daughter.  HENT:  Mouth/Throat: No oropharyngeal exudate.  Eyes: Conjunctivae are normal.  Cardiovascular: Normal rate and regular rhythm.   No murmur heard. Pulmonary/Chest: Effort normal and breath sounds normal.  Abdominal: Soft. She exhibits no mass. There is no tenderness.  Musculoskeletal:  Normal range of motion.  Neurological: She is alert and oriented to person, place, and time.  Skin: No rash noted.  Psychiatric: Mood and affect normal.    Lab Results Lab Results  Component Value Date   WBC 6.7 07/22/2015   HGB 10.3 (L) 07/22/2015   HCT 33.2 (L) 07/22/2015   MCV 78.9 07/22/2015   PLT 413 (H) 07/22/2015    Lab Results  Component Value Date   CREATININE 1.18 (H) 07/22/2015   BUN 14 07/22/2015   NA 138 07/22/2015   K 4.1 07/22/2015   CL 103 07/22/2015   CO2 28 07/22/2015    Lab Results  Component Value Date    ALT 12 07/22/2015   AST 18 07/22/2015   ALKPHOS 71 07/22/2015   BILITOT 0.4 07/22/2015    Lab Results  Component Value Date   CHOL 177 07/22/2015   HDL 83 07/22/2015   LDLCALC 79 07/22/2015   TRIG 73 07/22/2015   CHOLHDL 2.1 07/22/2015   HIV 1 RNA Quant (copies/mL)  Date Value  07/22/2015 <20  01/07/2015 <20  05/27/2014 <20   CD4 T Cell Abs (/uL)  Date Value  07/22/2015 1,060  01/07/2015 850  05/27/2014 1,000     Problem List Items Addressed This Visit      High   Human immunodeficiency virus (HIV) disease ([place])    Her adherence is off just slightly but I expect that her infection will remain under excellent, long-term control. She will continue [name]. I will repeat lab work today and have her follow-up in 6 months.      Relevant Orders   T-helper cell (CD4)- ([facility] clinic only)   HIV 1 RNA quant-no reflex-bld   CBC   Comprehensive metabolic panel     Medium   Iron deficiency anemia    She was instructed to change the time that she takes her iron to separate it from [name]. She will take it each day at lunchtime. I will repeat her CBC today.       Other Visit Diagnoses   None.       Michel Bickers, MD [facility] for [facility] [place] [facility] [phone] pager   [phone] cell 01/26/2016, 11:41 AM

## 2016-01-26 NOTE — Assessment & Plan Note (Signed)
She was instructed to change the time that she takes her iron to separate it from [name]. She will take it each day at lunchtime. I will repeat her CBC today.

## 2016-01-26 NOTE — Assessment & Plan Note (Signed)
Her adherence is off just slightly but I expect that her infection will remain under excellent, long-term control. She will continue [name]. I will repeat lab work today and have her follow-up in 6 months.

## 2016-01-27 ENCOUNTER — Other Ambulatory Visit: Payer: Self-pay | Admitting: *Deleted

## 2016-01-27 DIAGNOSIS — B20 Human immunodeficiency virus [HIV] disease: Secondary | ICD-10-CM

## 2016-01-27 LAB — HIV-1 RNA QUANT-NO REFLEX-BLD: HIV 1 RNA Quant: <20 copies/mL (ref <20)

## 2016-01-27 LAB — T-HELPER CELL (CD4) - (RCID CLINIC ONLY)
CD4 % Helper T Cell: 34 % (ref 33–55)
CD4 T Cell Abs: 830 /uL (ref 400–2700)

## 2016-01-27 MED ORDER — ABACAVIR-DOLUTEGRAVIR-LAMIVUD 600-50-300 MG PO TABS: 1.0000 | ORAL_TABLET | Freq: Every day | ORAL | 11 refills | Status: DC

## 2016-02-19 ENCOUNTER — Other Ambulatory Visit: Payer: Self-pay

## 2016-02-19 DIAGNOSIS — Z1231 Encounter for screening mammogram for malignant neoplasm of breast: Secondary | ICD-10-CM

## 2016-03-16 ENCOUNTER — Encounter: Payer: Self-pay | Admitting: *Deleted

## 2016-05-16 ENCOUNTER — Encounter: Payer: Self-pay | Admitting: Obstetrics

## 2016-05-16 ENCOUNTER — Ambulatory Visit (INDEPENDENT_AMBULATORY_CARE_PROVIDER_SITE_OTHER): Payer: 59 | Admitting: Obstetrics

## 2016-05-16 ENCOUNTER — Other Ambulatory Visit: Payer: Self-pay | Admitting: Obstetrics

## 2016-05-16 ENCOUNTER — Encounter: Payer: Self-pay | Admitting: *Deleted

## 2016-05-16 VITALS — BP 164/110 | HR 89 | Ht 66.0 in | Wt 195.0 lb

## 2016-05-16 DIAGNOSIS — Z124 Encounter for screening for malignant neoplasm of cervix: Secondary | ICD-10-CM

## 2016-05-16 DIAGNOSIS — Z9889 Other specified postprocedural states: Secondary | ICD-10-CM

## 2016-05-16 DIAGNOSIS — Z01419 Encounter for gynecological examination (general) (routine) without abnormal findings: Secondary | ICD-10-CM | POA: Diagnosis not present

## 2016-05-16 DIAGNOSIS — Z1151 Encounter for screening for human papillomavirus (HPV): Secondary | ICD-10-CM | POA: Diagnosis not present

## 2016-05-16 DIAGNOSIS — D259 Leiomyoma of uterus, unspecified: Secondary | ICD-10-CM | POA: Diagnosis not present

## 2016-05-16 DIAGNOSIS — Z1231 Encounter for screening mammogram for malignant neoplasm of breast: Secondary | ICD-10-CM

## 2016-05-16 NOTE — Addendum Note (Signed)
Addended by: Baltazar Najjar A on: 05/16/2016 01:47 PM   Modules accepted: Orders

## 2016-05-16 NOTE — Progress Notes (Signed)
Subjective:     Bailey Morris is a 45 y.o. female here for a routine exam.  Current complaints: Heavy and painful periods.  Periods last 5-7 days, with heavy and painful flow ~ 3 days.  Nulliparous.  Has a 7 month old infant that she recently adopted.  History of myomectomies x 2 in 2008 and 2014.  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  Personal health questionnaire:  Is patient Ashkenazi Jewish, have a family history of breast and/or ovarian cancer: no Is there a family history of uterine cancer diagnosed at age < 4, gastrointestinal cancer, urinary tract cancer, family member who is a Field seismologist syndrome-associated carrier: no Is the patient overweight and hypertensive, family history of diabetes, personal history of gestational diabetes, preeclampsia or PCOS: no Is patient over 23, have PCOS,  family history of premature CHD under age 17, diabetes, smoke, have hypertension or peripheral artery disease:  no At any time, has a partner hit, kicked or otherwise hurt or frightened you?: no Over the past 2 weeks, have you felt down, depressed or hopeless?: no Over the past 2 weeks, have you felt little interest or pleasure in doing things?:no   Gynecologic History Patient's last menstrual period was 05/15/2016 (exact date). Contraception: abstinence Last Pap: 2016. Results were: normal Last mammogram: 2016. Results were: normal  Obstetric History OB History  No data available    Past Medical History:  Diagnosis Date  . Asthma   . HIV positive ([place])   . PONV (postoperative nausea and vomiting)     Past Surgical History:  Procedure Laterality Date  . MYOMECTOMY    . MYOMECTOMY  06/27/2012   Procedure: MYOMECTOMY;  Surgeon: Frederico Hamman, MD;  Location: [facility];  Service: Gynecology;  Laterality: N/A;  Abdominal     Current Outpatient Prescriptions:  .  abacavir-dolutegravir-lamiVUDine ([name]) 600-50-300 MG tablet, Take 1 tablet by mouth daily., Disp: 30 tablet, Rfl:  11 .  levocetirizine (XYZAL) 5 MG tablet, Take 5 mg by mouth every evening., Disp: , Rfl:  .  montelukast (SINGULAIR) 10 MG tablet, Take 10 mg by mouth at bedtime. , Disp: , Rfl:  .  beclomethasone (QVAR) 40 MCG/ACT inhaler, Inhale 2 puffs into the lungs 2 (two) times daily as needed. For wheezing, Disp: , Rfl:  .  ibuprofen (ADVIL,MOTRIN) 800 MG tablet, Take 1 tablet (800 mg total) by mouth every 8 (eight) hours as needed for pain. (Patient not taking: Reported on 05/16/2016), Disp: 30 tablet, Rfl: 5 Allergies  Allergen Reactions  . Sulfa Antibiotics   . Sulfamethoxazole-Trimethoprim Other (See Comments)    REACTION: Severe Joint pain.  Marland Kitchen Fluticasone-Salmeterol Anxiety    Made jitteriness Made jitteriness    Social History  Substance Use Topics  . Smoking status: Never Smoker  . Smokeless tobacco: Never Used  . Alcohol use 0.0 oz/week    0 drink(s) per week     Comment: socially    History reviewed. No pertinent family history.    Review of Systems  Constitutional: negative for fatigue and weight loss Respiratory: negative for cough and wheezing Cardiovascular: negative for chest pain, fatigue and palpitations Gastrointestinal: negative for abdominal pain and change in bowel habits Musculoskeletal:negative for myalgias Neurological: negative for gait problems and tremors Behavioral/Psych: negative for abusive relationship, depression Endocrine: negative for temperature intolerance    Genitourinary:positive for abnormal menstrual periods Integument/breast: negative for breast lump, breast tenderness, nipple discharge and skin lesion(s)    Objective:  BP (!) 164/110 (BP Location: Right Arm)   Pulse 89   Ht 5\' 6"  (1.676 m)   Wt 195 lb (88.5 kg)   LMP 05/15/2016 (Exact Date)   BMI 31.47 kg/m  General:   alert  Skin:   no rash or abnormalities  Lungs:   clear to auscultation bilaterally  Heart:   regular rate and rhythm, S1, S2 normal, no murmur, click, rub or  gallop  Breasts:   normal without suspicious masses, skin or nipple changes or axillary nodes  Abdomen:  normal findings: no organomegaly, soft, non-tender and no hernia  Pelvis:  External genitalia: normal general appearance Urinary system: urethral meatus normal and bladder without fullness, nontender Vaginal: normal without tenderness, induration or masses Cervix: normal appearance Adnexa: normal bimanual exam Uterus: anteverted and non-tender, enlarged ~ 16 weeks size   Lab Review Urine pregnancy test Labs reviewed yes Radiologic studies reviewed yes  50% of 20 min visit spent on counseling and coordination of care.    Assessment:    Healthy female exam.    Uterine Fibroids.  S/P myomectomy x 2.  AUB, secondary to fibroids  Dysmenorrhea, secondary to fibroids  HIV positive.  Stable, with undetectable viral loads.   Plan:    Ultrasound ordered  Education reviewed: calcium supplements, low fat, low cholesterol diet, safe sex/STD prevention, self breast exams and weight bearing exercise. Mammogram ordered. Follow up in: 2 weeks.   No orders of the defined types were placed in this encounter.  No orders of the defined types were placed in this encounter.

## 2016-05-18 ENCOUNTER — Other Ambulatory Visit: Payer: Self-pay | Admitting: Obstetrics

## 2016-05-18 DIAGNOSIS — N760 Acute vaginitis: Principal | ICD-10-CM

## 2016-05-18 DIAGNOSIS — B9689 Other specified bacterial agents as the cause of diseases classified elsewhere: Secondary | ICD-10-CM

## 2016-05-18 LAB — CYTOLOGY - PAP
DIAGNOSIS: NEGATIVE
HPV: DETECTED — AB

## 2016-05-18 MED ORDER — METRONIDAZOLE 500 MG PO TABS: 500.0000 mg | ORAL_TABLET | Freq: Two times a day (BID) | ORAL | 2 refills | Status: DC

## 2016-05-19 ENCOUNTER — Other Ambulatory Visit: Payer: Self-pay | Admitting: Obstetrics

## 2016-05-19 LAB — NUSWAB VG+, CANDIDA 6SP
ATOPOBIUM VAGINAE: HIGH {score} — AB
BVAB 2: HIGH {score} — AB
CANDIDA GLABRATA, NAA: NEGATIVE
CANDIDA KRUSEI, NAA: NEGATIVE
CANDIDA LUSITANIAE, NAA: NEGATIVE
CANDIDA PARAPSILOSIS, NAA: NEGATIVE
CANDIDA TROPICALIS, NAA: NEGATIVE
Candida albicans, NAA: NEGATIVE
Chlamydia trachomatis, NAA: NEGATIVE
Megasphaera 1: HIGH Score — AB
Neisseria gonorrhoeae, NAA: NEGATIVE
TRICH VAG BY NAA: NEGATIVE

## 2016-05-25 ENCOUNTER — Telehealth: Payer: Self-pay

## 2016-05-25 DIAGNOSIS — N760 Acute vaginitis: Principal | ICD-10-CM

## 2016-05-25 DIAGNOSIS — B9689 Other specified bacterial agents as the cause of diseases classified elsewhere: Secondary | ICD-10-CM

## 2016-05-25 MED ORDER — METRONIDAZOLE 500 MG PO TABS: 500.0000 mg | ORAL_TABLET | Freq: Two times a day (BID) | ORAL | 2 refills | Status: DC

## 2016-05-25 NOTE — Telephone Encounter (Signed)
Spoke with pt about rx and results, pt verified pharmacy.

## 2016-05-27 ENCOUNTER — Ambulatory Visit (INDEPENDENT_AMBULATORY_CARE_PROVIDER_SITE_OTHER): Payer: 59 | Admitting: Obstetrics

## 2016-05-27 ENCOUNTER — Ambulatory Visit (INDEPENDENT_AMBULATORY_CARE_PROVIDER_SITE_OTHER): Payer: 59

## 2016-05-27 ENCOUNTER — Encounter: Payer: Self-pay | Admitting: Obstetrics

## 2016-05-27 VITALS — BP 166/103 | HR 95 | Temp 98.3°F | Wt 195.3 lb

## 2016-05-27 DIAGNOSIS — D259 Leiomyoma of uterus, unspecified: Secondary | ICD-10-CM | POA: Diagnosis not present

## 2016-05-27 DIAGNOSIS — Z9889 Other specified postprocedural states: Secondary | ICD-10-CM

## 2016-05-27 NOTE — Progress Notes (Signed)
Patient ID: Bailey Morris, female   DOB: 1971/04/06, 45 y.o.   MRN: [id number]  Chief Complaint  Patient presents with  . Follow-up    U/S results    HPI Bailey Morris is a 45 y.o. female.  Uterine fibroids.  S/P myomectomy in 2014.  Presents for ultrasound follow up.  No complaints. HPI  Past Medical History:  Diagnosis Date  . Asthma   . HIV positive ([place])   . PONV (postoperative nausea and vomiting)     Past Surgical History:  Procedure Laterality Date  . MYOMECTOMY    . MYOMECTOMY  06/27/2012   Procedure: MYOMECTOMY;  Surgeon: Frederico Hamman, MD;  Location: [facility];  Service: Gynecology;  Laterality: N/A;  Abdominal    History reviewed. No pertinent family history.  Social History Social History  Substance Use Topics  . Smoking status: Never Smoker  . Smokeless tobacco: Never Used  . Alcohol use 0.0 oz/week    0 drink(s) per week     Comment: socially    Allergies  Allergen Reactions  . Sulfa Antibiotics   . Sulfamethoxazole-Trimethoprim Other (See Comments)    REACTION: Severe Joint pain.  Marland Kitchen Fluticasone-Salmeterol Anxiety    Made jitteriness Made jitteriness    Current Outpatient Prescriptions  Medication Sig Dispense Refill  . abacavir-dolutegravir-lamiVUDine ([name]) 600-50-300 MG tablet Take 1 tablet by mouth daily. 30 tablet 11  . beclomethasone (QVAR) 40 MCG/ACT inhaler Inhale 2 puffs into the lungs 2 (two) times daily as needed. For wheezing    . ibuprofen (ADVIL,MOTRIN) 800 MG tablet Take 1 tablet (800 mg total) by mouth every 8 (eight) hours as needed for pain. 30 tablet 5  . levocetirizine (XYZAL) 5 MG tablet Take 5 mg by mouth every evening.    . metroNIDAZOLE (FLAGYL) 500 MG tablet Take 1 tablet (500 mg total) by mouth 2 (two) times daily. 14 tablet 2  . montelukast (SINGULAIR) 10 MG tablet Take 10 mg by mouth at bedtime.      No current facility-administered medications for this visit.     Review of Systems Review of  Systems Constitutional: negative for fatigue and weight loss Respiratory: negative for cough and wheezing Cardiovascular: negative for chest pain, fatigue and palpitations Gastrointestinal: negative for abdominal pain and change in bowel habits Genitourinary:negative Integument/breast: negative for nipple discharge Musculoskeletal:negative for myalgias Neurological: negative for gait problems and tremors Behavioral/Psych: negative for abusive relationship, depression Endocrine: negative for temperature intolerance      Blood pressure (!) 166/103, pulse 95, temperature 98.3 F (36.8 C), temperature source Oral, weight 195 lb 4.8 oz (88.6 kg), last menstrual period 05/15/2016.  Physical Exam Physical Exam:  Deferred                                                                                                                                                                                                                          >  50% of 10 min visit spent on counseling and coordination of care.    Data Reviewed Ultrasound  Assessment     Uterine fibroids.  Stable clinically and on ultrasound follow up 3 years after myomectomy.    Plan    Continue yearly pelvic exams. Follow up in 1 year.  No orders of the defined types were placed in this encounter.  No orders of the defined types were placed in this encounter.

## 2016-06-21 ENCOUNTER — Inpatient Hospital Stay: Admission: RE | Admit: 2016-06-21 | Payer: 59 | Source: Ambulatory Visit

## 2016-06-22 ENCOUNTER — Ambulatory Visit
Admission: RE | Admit: 2016-06-22 | Discharge: 2016-06-22 | Disposition: A | Discharge status: Home / Self Care | Payer: 59 | Source: Ambulatory Visit | Attending: Obstetrics | Admitting: Obstetrics

## 2016-06-22 DIAGNOSIS — Z1231 Encounter for screening mammogram for malignant neoplasm of breast: Secondary | ICD-10-CM

## 2016-09-14 ENCOUNTER — Other Ambulatory Visit: Payer: 59

## 2016-09-14 DIAGNOSIS — B20 Human immunodeficiency virus [HIV] disease: Secondary | ICD-10-CM

## 2016-09-14 DIAGNOSIS — Z113 Encounter for screening for infections with a predominantly sexual mode of transmission: Secondary | ICD-10-CM

## 2016-09-14 LAB — CBC WITH DIFFERENTIAL/PLATELET
BASOS ABS: 0 {cells}/uL (ref 0–200)
Basophils Relative: 0 %
EOS ABS: 68 {cells}/uL (ref 15–500)
Eosinophils Relative: 1 %
HEMATOCRIT: 35 % (ref 35.0–45.0)
Hemoglobin: 11.7 g/dL (ref 11.7–15.5)
Lymphocytes Relative: 35 %
Lymphs Abs: 2380 cells/uL (ref 850–3900)
MCH: 28 pg (ref 27.0–33.0)
MCHC: 33.4 g/dL (ref 32.0–36.0)
MCV: 83.7 fL (ref 80.0–100.0)
MPV: 9.8 fL (ref 7.5–12.5)
Monocytes Absolute: 340 cells/uL (ref 200–950)
Monocytes Relative: 5 %
Neutro Abs: 4012 cells/uL (ref 1500–7800)
Neutrophils Relative %: 59 %
Platelets: 352 10*3/uL (ref 140–400)
RBC: 4.18 MIL/uL (ref 3.80–5.10)
RDW: 14.8 % (ref 11.0–15.0)
WBC: 6.8 10*3/uL (ref 3.8–10.8)

## 2016-09-14 LAB — COMPLETE METABOLIC PANEL WITH GFR
ALBUMIN: 4 g/dL (ref 3.6–5.1)
ALK PHOS: 65 U/L (ref 33–115)
ALT: 8 U/L (ref 6–29)
AST: 12 U/L (ref 10–30)
BUN: 13 mg/dL (ref 7–25)
CALCIUM: 9.4 mg/dL (ref 8.6–10.2)
CHLORIDE: 104 mmol/L (ref 98–110)
CO2: 21 mmol/L (ref 20–31)
Creat: 1.03 mg/dL (ref 0.50–1.10)
GFR, Est African American: 76 mL/min (ref >=60)
GFR, Est Non African American: 66 mL/min (ref >=60)
Glucose, Bld: 66 mg/dL (ref 65–99)
POTASSIUM: 3.9 mmol/L (ref 3.5–5.3)
Sodium: 139 mmol/L (ref 135–146)
Total Bilirubin: 0.4 mg/dL (ref 0.2–1.2)
Total Protein: 7.8 g/dL (ref 6.1–8.1)

## 2016-09-15 LAB — RPR

## 2016-09-15 LAB — T-HELPER CELL (CD4) - (RCID CLINIC ONLY)
CD4 T CELL HELPER: 40 % (ref 33–55)
CD4 T Cell Abs: 1040 /uL (ref 400–2700)

## 2016-09-16 LAB — HIV-1 RNA QUANT-NO REFLEX-BLD
HIV 1 RNA Quant: <20 {copies}/mL
HIV-1 RNA Quant, Log: <1.3 {Log_copies}/mL

## 2016-09-28 ENCOUNTER — Encounter: Payer: Self-pay | Admitting: Internal Medicine

## 2016-09-28 ENCOUNTER — Ambulatory Visit (INDEPENDENT_AMBULATORY_CARE_PROVIDER_SITE_OTHER): Payer: 59 | Admitting: Internal Medicine

## 2016-09-28 DIAGNOSIS — B20 Human immunodeficiency virus [HIV] disease: Secondary | ICD-10-CM

## 2016-09-28 DIAGNOSIS — F321 Major depressive disorder, single episode, moderate: Secondary | ICD-10-CM

## 2016-09-28 DIAGNOSIS — D500 Iron deficiency anemia secondary to blood loss (chronic): Secondary | ICD-10-CM | POA: Diagnosis not present

## 2016-09-28 NOTE — Progress Notes (Signed)
Patient Active Problem List   Diagnosis Date Noted  . Human immunodeficiency virus (HIV) disease ([place]) 01/10/2007    Priority: High  . Iron deficiency anemia 08/06/2015    Priority: Medium  . ASTHMA 08/10/2006    Priority: Medium  . PROTEINURIA 07/21/2009  . MYOMA 07/11/2007  . VENEREAL WART 08/10/2006  . Depression 08/10/2006  . MENORRHAGIA 08/10/2006  . SCOLIOSIS 08/10/2006  . SHINGLES, HX OF 08/10/2006    Patient's Medications  New Prescriptions   No medications on file  Previous Medications   ABACAVIR-DOLUTEGRAVIR-LAMIVUDINE ([name]) 600-50-300 MG TABLET    Take 1 tablet by mouth daily.   BECLOMETHASONE (QVAR) 40 MCG/ACT INHALER    Inhale 2 puffs into the lungs 2 (two) times daily as needed. For wheezing   IBUPROFEN (ADVIL,MOTRIN) 800 MG TABLET    Take 1 tablet (800 mg total) by mouth every 8 (eight) hours as needed for pain.   LEVOCETIRIZINE (XYZAL) 5 MG TABLET    Take 5 mg by mouth every evening.   MONTELUKAST (SINGULAIR) 10 MG TABLET    Take 10 mg by mouth at bedtime.   Modified Medications   No medications on file  Discontinued Medications   METRONIDAZOLE (FLAGYL) 500 MG TABLET    Take 1 tablet (500 mg total) by mouth 2 (two) times daily.    Subjective: Bailey Morris is in for her routine follow-up visit. Her daughter, Bailey Morris, is healthy and growing fast. She is 74 months old. Bailey Morris continues to work full-time at work as well. She will occasionally forget to take her [name] at bedtime but will wake up during the night and take it or take it first thing in the morning. She does not recall ever completely missing a dose. She has not been taking her iron on a consistent basis because she was told to not take it at the same time as her [name] and she often forgets it. She is still having heavy menstrual periods. She describes her mood has "regular" and "average".   Review of Systems: Review of Systems  Constitutional: Negative for chills, diaphoresis, fever,  malaise/fatigue and weight loss.  HENT: Negative for sore throat.   Respiratory: Negative for cough, sputum production and shortness of breath.   Cardiovascular: Negative for chest pain.  Gastrointestinal: Negative for diarrhea, nausea and vomiting.  Genitourinary: Negative for dysuria and frequency.  Musculoskeletal: Negative for joint pain and myalgias.  Skin: Positive for itching and rash.  Neurological: Negative for dizziness and headaches.  Psychiatric/Behavioral: Positive for depression. Negative for substance abuse. The patient is not nervous/anxious.     Past Medical History:  Diagnosis Date  . Asthma   . HIV positive (Bailey Morris)   . PONV (postoperative nausea and vomiting)     Social History  Substance Use Topics  . Smoking status: Never Smoker  . Smokeless tobacco: Never Used  . Alcohol use 0.0 oz/week    0 drink(s) per week     Comment: socially    No family history on file.  Allergies  Allergen Reactions  . Sulfa Antibiotics   . Sulfamethoxazole-Trimethoprim Other (See Comments)    REACTION: Severe Joint pain.  Marland Kitchen Fluticasone-Salmeterol Anxiety    Made jitteriness Made jitteriness    Objective:  Vitals:   09/28/16 1619  BP: (!) 149/107  Pulse: 82  Temp: 98 F (36.7 C)  TempSrc: Oral  Weight: 190 lb (86.2 kg)  Height: 5\' 5"  (1.651 m)   Body mass  index is 31.62 kg/m.  Physical Exam  Constitutional: She is oriented to person, place, and time.  She is in good spirits.  HENT:  Mouth/Throat: No oropharyngeal exudate.  Eyes: Conjunctivae are normal.  Cardiovascular: Normal rate and regular rhythm.   No murmur heard. Pulmonary/Chest: Effort normal and breath sounds normal.  Abdominal: Soft. She exhibits no mass. There is no tenderness.  Musculoskeletal: Normal range of motion.  Neurological: She is alert and oriented to person, place, and time.  Skin: Rash noted.  She has a dime-sized area of erythema that looks almost like a bruise at the 10:00  position of her left breast. It began a few weeks ago and is slowly resolving.  Psychiatric: Mood and affect normal.    Lab Results Lab Results  Component Value Date   WBC 6.8 09/14/2016   HGB 11.7 09/14/2016   HCT 35.0 09/14/2016   MCV 83.7 09/14/2016   PLT 352 09/14/2016    Lab Results  Component Value Date   CREATININE 1.03 09/14/2016   BUN 13 09/14/2016   NA 139 09/14/2016   K 3.9 09/14/2016   CL 104 09/14/2016   CO2 21 09/14/2016    Lab Results  Component Value Date   ALT 8 09/14/2016   AST 12 09/14/2016   ALKPHOS 65 09/14/2016   BILITOT 0.4 09/14/2016    Lab Results  Component Value Date   CHOL 177 07/22/2015   HDL 83 07/22/2015   LDLCALC 79 07/22/2015   TRIG 73 07/22/2015   CHOLHDL 2.1 07/22/2015   HIV 1 RNA Quant (copies/mL)  Date Value  09/14/2016 <20 NOT DETECTED  01/26/2016 <20  07/22/2015 <20   CD4 T Cell Abs (/uL)  Date Value  09/14/2016 1,040  01/26/2016 830  07/22/2015 1,060     Problem List Items Addressed This Visit      High   Human immunodeficiency virus (HIV) disease ([place])    Her infection is under excellent, long-term control. She will continue [name] and follow-up after lab work in 6 months.      Relevant Orders   T-helper cell (CD4)- ([facility] only)   HIV 1 RNA quant-no reflex-bld   CBC     Medium   Iron deficiency anemia    Her hemoglobin remains normal but has drifted down about 2 g over the past 6 months. She will stay off of iron supplements for now and we will recheck her hemoglobin before her visit in 6 months.        Unprioritized   Depression    She has chronic, smoldering depression. She is agreeable to starting counseling with one of our counselors soon.           Michel Bickers, MD [facility] [place] [phone] pager   [phone] cell 09/28/2016, 5:20 PM

## 2016-09-28 NOTE — Assessment & Plan Note (Signed)
She has chronic, smoldering depression. She is agreeable to starting counseling with one of our counselors soon.

## 2016-09-28 NOTE — Assessment & Plan Note (Signed)
Her hemoglobin remains normal but has drifted down about 2 g over the past 6 months. She will stay off of iron supplements for now and we will recheck her hemoglobin before her visit in 6 months.

## 2016-09-28 NOTE — Assessment & Plan Note (Signed)
Her infection is under excellent, long-term control. She will continue [name] and follow-up after lab work in 6 months.

## 2016-11-21 ENCOUNTER — Ambulatory Visit: Payer: 59 | Admitting: Licensed Clinical Social Worker

## 2016-11-21 DIAGNOSIS — F439 Reaction to severe stress, unspecified: Secondary | ICD-10-CM

## 2016-11-23 NOTE — Progress Notes (Signed)
Bailey Morris, is a 46 y.o. African American female.  MRN: [id number]    Time started: 1:40 pm    Time ended: 2:10 pm  Behavior: Tearful; Friendly and cooperative Mood: Sad Affect: Labile Thought Process: Tangential Thought Content: Logical Thought Disorder and Perceptual Anomalies: None observed or self-reported S/I and H/I: Denies current ideations, plan, or intent  Referral Source: Dr. Megan Salon  Content of Session: Met with patient for first follow-up session to assess symptoms and determine level of dysfunction.  Intervention Provided:  Assessed current symptoms.  Processed family dynamics and interaction patterns.  Response: Patient denied having a currently depressed mood.  Patient also denied experience of the following symptoms: anhedonia, social isolation, sleeping or appetite changes, distractibility or concentration changes, hopelessness, and denied current suicidal ideations, plan, or intent.  Patient was receptive that she does not currently meet criteria for major depression but was open to experiencing relationship and interaction difficulties with her parents, unmet emotional needs, and unresolved guilt over contracting HIV.  Patient was open is discussing her "triggers" to dysfunction as seeing pregnant women and her relations with her parents.  Patient was vocal in exploring the issues with her overly critical parents and the high expectations she set for herself.  Patient also explored the symbolism attached to receiving HIV-related care and the physical building and her dislike of coming into the building for treatment.  The patient verbalized that the building is  symbolic of her "failure" and a reminder of her positive status.  The patient was receptive that she still has unresolved guilt related to her diagnosis.  Patient was active in reviewing handout for exploring Unmet Emotional Needs and was able to identify her need to be touched and hugged affectionately.  Patient  was engaged in discussing her difficulty with dating and difficulty with meeting this need.  Patient gained insight that she can meet this need other ways than being romantic and that other people can assist in meeting the need.  Patient was receptive to receiving homework assignment of locating someone specific to share her need with and developing a target date for sharing.  She was also receptive to identifying specific ways to meet the need and developing a target date for acting.  Patient was receptive to using journaling to explore her feelings related to guilt and the internalized socialization of being overly self-critical.  Plan:  Continue to explore family dynamics and interaction patterns. Review homework assignments and explore unmet emotional needs and unresolved guilt. Next session is scheduled for Friday June 15th at 10:45 am.  Sande Rives, [facility]

## 2016-11-30 ENCOUNTER — Telehealth: Payer: Self-pay | Admitting: Licensed Clinical Social Worker

## 2016-11-30 NOTE — Telephone Encounter (Signed)
Spoke with patient about need to reschedule appointment.  Appointment rescheduled from Fri June 15 th at 10:45 am to Mon June 18 at 10:15 am.  Sande Rives, [facility]

## 2016-12-02 ENCOUNTER — Ambulatory Visit: Payer: 59 | Admitting: Licensed Clinical Social Worker

## 2016-12-05 ENCOUNTER — Ambulatory Visit: Payer: 59 | Admitting: Licensed Clinical Social Worker

## 2016-12-07 ENCOUNTER — Ambulatory Visit (INDEPENDENT_AMBULATORY_CARE_PROVIDER_SITE_OTHER): Payer: 59 | Admitting: Licensed Clinical Social Worker

## 2016-12-07 DIAGNOSIS — F439 Reaction to severe stress, unspecified: Secondary | ICD-10-CM

## 2016-12-07 NOTE — Progress Notes (Signed)
Integrated Behavioral Health Follow Up Visit  MRN: [id number] Name: Bailey Morris   Session Start time: 2:45 pm Session End time: 3:45 pm Total time: 1 hour Number of Integrated Behavioral Health Clinician visits: 2/10  Type of Service: [place] Interpretor:No. Interpretor Name and Language: N/A   Warm Hand Off Completed.       SUBJECTIVE: Bailey Morris is a 46 y.o. female accompanied by patient. Patient was referred by Dr. Megan Salon for depressive symptoms.  Patient was able to review her homework assignment on unmet emotional needs and was able to identify the greatest needs.  Patient verbalized that she shared most of her emotional needs with others except for one to be touched, patted, and hugged affectionately.  Patient was receptive to exploring actions she can take to assist in meeting her needs and setting deadlines.  Patient was receptive in exploring the factors that have prevented her from forming relationships and was able to process blocks in her thinking.  Patient was receptive to exploring the desired characteristics of a mate and exploring her thought process about being single in the future.  Patient was receptive to using visualization tools to evaluate her thinking patterns.  Patient also verbalized her thoughts related to receiving HIV-related treatment and her strong dislike of coming into the building to receive care.  Patient reported that she has been receiving care for over 10 years and has always had difficulty with entering the building.  Patient verbalized her association of receiving care with a reminder of the act that led to contracting the virus and a reminder of her guilt.  OBJECTIVE: Mood: Depressed and Affect: within range Risk of harm to self or others: No plan to harm self or others  GOALS ADDRESSED: Patient will reduce symptoms of: depression and increase knowledge and/or ability of: coping skills and  also: Increase healthy adjustment to current life circumstances  INTERVENTIONS: Solution-Focused Strategies Explored patient's thought process regarding dating and her perception of the impact of her HIV status on dating. Evaluated patient's connection with others.  Educated patient on helpfulness of using visualization tools such as visualization boards, daily affirmations, meditation, and gratitude journals.  ASSESSMENT: Patient currently experiencing depressive symptoms related to fear of dating and fear of "dying alone" with HIV. Patient may benefit from continued mental health services and exploring and challenging her thinking patterns.  PLAN: 1. Follow up with behavioral health clinician on : Next session on Monday July 16th at 8:45 am 2. Behavioral recommendations: Patient will begin a gratitude journal and write letter to self    Sande Rives, [facility]

## 2017-01-02 ENCOUNTER — Ambulatory Visit (INDEPENDENT_AMBULATORY_CARE_PROVIDER_SITE_OTHER): Payer: 59 | Admitting: Licensed Clinical Social Worker

## 2017-01-02 DIAGNOSIS — F439 Reaction to severe stress, unspecified: Secondary | ICD-10-CM

## 2017-01-02 NOTE — Progress Notes (Signed)
Integrated Behavioral Health Follow Up Visit  MRN: [id number] Name: Bailey Morris   Session Start time: 8:50 am Session End time: 9:45 am Total time: 55 minutes Number of Integrated Behavioral Health Clinician visits: 3/10  Type of Service: [place] Interpretor:No. Interpretor Name and Language: N/A   Warm Hand Off Completed.       SUBJECTIVE: Bailey Morris is a 46 y.o. female accompanied by patient. Patient was referred by Dr. Megan Salon for depressive symptoms.  Patient was able to verbalize gains and struggles from writing in her gratitude journal.  Patient presented as insightful that focusing on gratitude impacts her thinking patterns and presented with self-awareness of her tendency to think negative.  Patient verbalized her struggles against being pessimistic and with remaining thankful.  Patient reported that she is trying to use the journal to improve her mood.  Patient also denied completing the homework assignment of writing a letter to herself, thanking herself for being compliant with treatment recommendations and staying healthy and managing HIV.  Patient was asked to complete the assignment again and she was receptive.  Patient was also receptive to exploring her family interaction and communication patterns.   OBJECTIVE: Mood: Depressed and Affect: within range Risk of harm to self or others: No plan to harm self or others  Thought process: tangential Thought content: logical  GOALS ADDRESSED: Patient will reduce symptoms of: depression and increase knowledge and/or ability of: coping skills and also: Increase healthy adjustment to current life circumstances and Increase adequate support systems for patient/family  INTERVENTIONS: Solution-Focused Strategies Explored therapeutic gains from using gratitude journal and challenged negative thinking patterns.  ASSESSMENT: Patient currently experiencing depressive symptoms  and negative thinking patterns and may benefit from continued mental health services and exploring and challenging thinking patterns.  PLAN: 1. Follow up with behavioral health clinician on : Monday July 23rd at 11:15 am 2. Behavioral recommendations: Continue to use gratitude journal, begin to log negative thinking patterns, and write letter to self.   Sande Rives, [facility]

## 2017-01-09 ENCOUNTER — Ambulatory Visit: Payer: 59 | Admitting: Licensed Clinical Social Worker

## 2017-01-16 ENCOUNTER — Ambulatory Visit (INDEPENDENT_AMBULATORY_CARE_PROVIDER_SITE_OTHER): Payer: 59 | Admitting: Licensed Clinical Social Worker

## 2017-01-16 ENCOUNTER — Other Ambulatory Visit: Payer: Self-pay | Admitting: Internal Medicine

## 2017-01-16 DIAGNOSIS — F439 Reaction to severe stress, unspecified: Secondary | ICD-10-CM

## 2017-01-16 DIAGNOSIS — B20 Human immunodeficiency virus [HIV] disease: Secondary | ICD-10-CM

## 2017-01-17 NOTE — Progress Notes (Signed)
Integrated Behavioral Health Follow Up Visit  MRN: [id number] Name: Bailey Morris   Session Start time: 11:25 am Session End time: 12:30 am Total time: 1 hour Number of Integrated Behavioral Health Clinician visits: 4/10  Type of Service: [place] Interpretor:No. Interpretor Name and Language: N/A   Warm Hand Off Completed.       SUBJECTIVE: Bailey Morris is a 46 y.o. female accompanied by patient. Patient was referred by Dr. Megan Salon for depressive symptoms.  Patient completed multiple assessments: PCL-C assessment and scored a 20, evidencing that she is not at all bothered by possible trauma symptoms.  Patient also completed the Major Depression Inventory and scored a 1.  Patient denied currently experiencing distress with the exception of dysfunctional communication and interaction patterns with her mother.  Patient reported that she completed the letter to herself and was able to evaluate and verbalize the process.  Patient reported that she was open to the experience and was not hesitant.  Patient was receptive to need to be gentle with herself and thankful that she has been able to adhere to her treatment regimen and care for herself.  Patient verbalized use of the gratitude journal and initially denied using it while on a family vacation.  Patient became receptive to using journal to assist in reframing her perception of her mother and her family.  Patient discussed family dynamics present during the vacation.  OBJECTIVE: Mood: Depressed and Affect: Tearful Risk of harm to self or others: No plan to harm self or others  Thought process: circumstantial; tanential Thought content: logical   ASSESSMENT: Patient denied currently experiencing depressive symptoms but is experiencing relationship and interaction difficulties with her parents and unresolved guilt.  Patient may benefit from learning communication and assertiveness skills  and building healthy boundaries with others.  GOALS ADDRESSED: Patient will reduce symptoms of: anxiety and stress and increase knowledge and/or ability of: coping skills and healthy self-esteem and also: Increase healthy adjustment to current life circumstances and Increase adequate support systems for patient/family  INTERVENTIONS: Solution-Focused Strategies Standardized Assessments completed: Major Depression Inventory and PCL-C.  PLAN: 1. Follow up with behavioral health clinician on : Monday, Aug 6th at 11:15 am 2. Behavioral recommendations: Continue to use gratitude journal and begin to log negative thinking patterns.   Sande Rives, [facility]

## 2017-01-23 ENCOUNTER — Ambulatory Visit: Payer: 59 | Admitting: Licensed Clinical Social Worker

## 2017-01-27 ENCOUNTER — Other Ambulatory Visit: Payer: Self-pay | Admitting: Internal Medicine

## 2017-01-27 DIAGNOSIS — B20 Human immunodeficiency virus [HIV] disease: Secondary | ICD-10-CM

## 2017-01-30 ENCOUNTER — Ambulatory Visit (INDEPENDENT_AMBULATORY_CARE_PROVIDER_SITE_OTHER): Payer: 59 | Admitting: Licensed Clinical Social Worker

## 2017-01-30 DIAGNOSIS — F439 Reaction to severe stress, unspecified: Secondary | ICD-10-CM

## 2017-01-30 NOTE — Progress Notes (Signed)
Integrated Behavioral Health Follow Up Visit  MRN: [id number] Name: Bailey Morris   Session Start time: 8:50 am Session End time: 10:00 am Total time: 1 hour and 10 mins Number of Integrated Behavioral Health Clinician visits: 5/10  Type of Service: [place] Interpretor:No. Interpretor Name and Language: N/A   Warm Hand Off Completed.       SUBJECTIVE: Bailey Morris is a 46 y.o. female accompanied by patient. Patient was referred by Dr. Megan Salon for depressive symptoms.  [facility] educated patient on communication styles and allowed patient to take questionnaires to find her primary communication style.  Patient scored highest in the Assertive communication style and was able to identify the communication styles of her co-workers and family members.  Patient was able to verbalize how she can use this knowledge to relate to others to improve communication and understanding of others' actions.  Patient verbalized how she can use the benefits of assertiveness to alter her thinking of others to become more positive.  Patient and [facility] also processed termination, with her next session being the last session, and her thoughts about continuing counseling.  Patient verbalized that she will call her work EAP to begin the process of securing EAP sessions and will continue mental health services.   Severity of problem: mild  OBJECTIVE: Mood: Euthymic and Affect: Appropriate Risk of harm to self or others: No plan to harm self or others  Thought process: coherent Thought content: logical  ASSESSMENT: Patient is currently experiencing poor communication and interaction patterns with her family and co-workers and may benefit from continuing to learn communication and assertiveness skills and building healthy boundaries with others.  GOALS ADDRESSED: Patient will reduce symptoms of: anxiety and stress and increase knowledge and/or ability of: coping skills  and healthy self-esteem and also: Increase healthy adjustment to current life circumstances and Increase adequate support systems for patient/family  INTERVENTIONS: Supportive Counseling and Psychoeducation and/or Health Education  PLAN: 1. Follow up with behavioral health clinician on : Monday Aug 20th at 1:30 pm (Last Session) 2. Next session will be the patient's last session.  Will address building healthy relationships hand out   Sande Rives, [facility]

## 2017-02-06 ENCOUNTER — Ambulatory Visit (INDEPENDENT_AMBULATORY_CARE_PROVIDER_SITE_OTHER): Payer: 59 | Admitting: Licensed Clinical Social Worker

## 2017-02-06 DIAGNOSIS — F439 Reaction to severe stress, unspecified: Secondary | ICD-10-CM

## 2017-02-06 NOTE — Progress Notes (Signed)
Integrated Behavioral Health Follow Up Visit  MRN: [id number] Name: [name]   Session Start time: 1:35 pm Session End time: 2:26 pm Total time: 50 minutes Number of Integrated Behavioral Health Clinician visits: 6/10  Type of Service: [place] Interpretor:No. Interpretor Name and Language: N/A   Warm Hand Off Completed.       SUBJECTIVE: Bailey Morris is a 46 y.o. female accompanied by patient. Patient was referred by Dr. Megan Salon for depressive symptoms.  Patient acknowledged today as the 6th and last session and reported that she still has plans to call her work EAP to continue receiving behavioral health services.  Patient discussed her job stress and reported that she did apply for a promotion.  Patient participated in a written activity regarding building health relationships and was able to identify herself as having health boundaries.  Patient was able to identify other people in her life that have collapsed and rigid boundaries and process ways to interact with them while maintaining healthy boundaries.  In reviewing symptom reduction, patient reported a reduction in experience of guilt over her HIV status.  Patient reported "maintaining more control" over guilt thoughts and not allowing them to led to negative thinking.  Patient also reported a slight change in her thinking about coming into the building related to behavioral health appointments.  Patient reported some change in thinking and decrease in anxiety and dread in coming into the building to attend medical appointments.  Patient reported increase in optimism from the use of learned coping strategies and reported increase in life satisfaction due to increase in positive thinking.  Patient reported increase in using learned ability to confront and challenge negative thinking.  Severity of problem: mild  OBJECTIVE: Behavior: Restless Mood: Anxious and Affect: within  range Risk of harm to self or others: No plan to harm self or others  Thought process: tangential Thought content: Logical  ASSESSMENT: Patient is currently experiencing poor communication and interaction patterns with her family and co-workers and may benefit from continuing to learn communication and assertiveness skills and building healthy boundaries with others.  Patient has made some improvement in functioning since beginning behavioral health services, such as reduction of guilt and less dread coming to appointments.  Patient has also increased optimism and positive thinking.    GOALS ADDRESSED: Patient will reduce symptoms of: anxiety and stress and increase knowledge and/or ability of: coping skills and also: Increase healthy adjustment to current life circumstances and Increase adequate support systems for patient/family  INTERVENTIONS: Supportive Counseling and Psychoeducation and/or Health Education Addressed termination of counseling relationship.  Encouraged patient to continue to receive behavioral health services.  Supported patient in discussion of work-related stressors. Reviewed therapeutic progress made since beginning behavioral health services.  PLAN:  1. Today was patient's last session. 2. Referral(s): work EAP service 3. Patient will call EAP to get authorization and referral to continue behavioral health services with a community-based provider.  Sande Rives, [facility]

## 2017-03-16 ENCOUNTER — Other Ambulatory Visit: Payer: Self-pay | Admitting: Internal Medicine

## 2017-03-16 ENCOUNTER — Other Ambulatory Visit: Payer: 59

## 2017-03-16 DIAGNOSIS — B20 Human immunodeficiency virus [HIV] disease: Secondary | ICD-10-CM

## 2017-03-16 LAB — CBC
HEMATOCRIT: 27.6 % — AB (ref 35.0–45.0)
HEMOGLOBIN: 8.5 g/dL — AB (ref 11.7–15.5)
MCH: 23 pg — AB (ref 27.0–33.0)
MCHC: 30.8 g/dL — ABNORMAL LOW (ref 32.0–36.0)
MCV: 74.6 fL — AB (ref 80.0–100.0)
MPV: 10.7 fL (ref 7.5–12.5)
PLATELETS: 437 10*3/uL — AB (ref 140–400)
RBC: 3.7 10*6/uL — AB (ref 3.80–5.10)
RDW: 14.7 % (ref 11.0–15.0)
WBC: 6.5 10*3/uL (ref 3.8–10.8)

## 2017-03-17 LAB — T-HELPER CELL (CD4) - (RCID CLINIC ONLY)
CD4 % Helper T Cell: 41 % (ref 33–55)
CD4 T Cell Abs: 970 /uL (ref 400–2700)

## 2017-03-17 LAB — COMPREHENSIVE METABOLIC PANEL
AG Ratio: 1.2 (calc) (ref 1.0–2.5)
ALBUMIN MSPROF: 4 g/dL (ref 3.6–5.1)
ALKALINE PHOSPHATASE (APISO): 53 U/L (ref 33–115)
ALT: 8 U/L (ref 6–29)
AST: 15 U/L (ref 10–35)
BUN/Creatinine Ratio: 11 (calc) (ref 6–22)
BUN: 13 mg/dL (ref 7–25)
CALCIUM: 9.3 mg/dL (ref 8.6–10.2)
CO2: 25 mmol/L (ref 20–32)
Chloride: 106 mmol/L (ref 98–110)
Creat: 1.14 mg/dL — ABNORMAL HIGH (ref 0.50–1.10)
Globulin: 3.4 g/dL (calc) (ref 1.9–3.7)
Glucose, Bld: 82 mg/dL (ref 65–99)
POTASSIUM: 4.3 mmol/L (ref 3.5–5.3)
Sodium: 140 mmol/L (ref 135–146)
Total Bilirubin: 0.3 mg/dL (ref 0.2–1.2)
Total Protein: 7.4 g/dL (ref 6.1–8.1)

## 2017-03-17 LAB — LIPID PANEL
CHOL/HDL RATIO: 1.9 (calc) (ref <5.0)
CHOLESTEROL: 134 mg/dL (ref <200)
HDL: 71 mg/dL (ref >50)
LDL Cholesterol (Calc): 50 mg/dL (calc)
Non-HDL Cholesterol (Calc): 63 mg/dL (calc) (ref <130)
TRIGLYCERIDES: 46 mg/dL (ref <150)

## 2017-03-18 LAB — HIV-1 RNA QUANT-NO REFLEX-BLD
HIV 1 RNA Quant: <20 copies/mL
HIV-1 RNA Quant, Log: <1.3 Log copies/mL

## 2017-03-24 ENCOUNTER — Telehealth: Payer: Self-pay | Admitting: Licensed Clinical Social Worker

## 2017-03-24 NOTE — Telephone Encounter (Signed)
[facility] called patient to introduce new therapist and to offer an appointment to resume behavioral health services.  Patient reported that she would think about it and call back to schedule apt.  Sande Rives, [facility]

## 2017-03-30 ENCOUNTER — Ambulatory Visit (INDEPENDENT_AMBULATORY_CARE_PROVIDER_SITE_OTHER): Payer: 59 | Admitting: Internal Medicine

## 2017-03-30 DIAGNOSIS — B20 Human immunodeficiency virus [HIV] disease: Secondary | ICD-10-CM | POA: Diagnosis not present

## 2017-03-30 DIAGNOSIS — D500 Iron deficiency anemia secondary to blood loss (chronic): Secondary | ICD-10-CM | POA: Diagnosis not present

## 2017-03-30 DIAGNOSIS — N289 Disorder of kidney and ureter, unspecified: Secondary | ICD-10-CM | POA: Diagnosis not present

## 2017-03-30 DIAGNOSIS — Z23 Encounter for immunization: Secondary | ICD-10-CM | POA: Diagnosis not present

## 2017-03-30 NOTE — Assessment & Plan Note (Signed)
She has a very minor bump in her creatinine. She will have this rechecked before her next visit.

## 2017-03-30 NOTE — Progress Notes (Signed)
Patient Active Problem List   Diagnosis Date Noted  . Human immunodeficiency virus (HIV) disease ([place]) 01/10/2007    Priority: High  . Iron deficiency anemia 08/06/2015    Priority: Medium  . ASTHMA 08/10/2006    Priority: Medium  . Renal insufficiency 03/30/2017  . PROTEINURIA 07/21/2009  . MYOMA 07/11/2007  . VENEREAL WART 08/10/2006  . Depression 08/10/2006  . MENORRHAGIA 08/10/2006  . SCOLIOSIS 08/10/2006  . SHINGLES, HX OF 08/10/2006    Patient's Medications  New Prescriptions   No medications on file  Previous Medications   BECLOMETHASONE (QVAR) 40 MCG/ACT INHALER    Inhale 2 puffs into the lungs 2 (two) times daily as needed. For wheezing   IBUPROFEN (ADVIL,MOTRIN) 800 MG TABLET    Take 1 tablet (800 mg total) by mouth every 8 (eight) hours as needed for pain.   LEVOCETIRIZINE (XYZAL) 5 MG TABLET    Take 5 mg by mouth every evening.   MONTELUKAST (SINGULAIR) 10 MG TABLET    Take 10 mg by mouth at bedtime.    [name] 600-50-300 MG TABLET    TAKE 1 TABLET BY MOUTH EVERY DAY  Modified Medications   No medications on file  Discontinued Medications   No medications on file    Subjective: Bailey Morris is in for her routine HIV follow-up visit. She has not had any problems obtaining, taking or tolerating her [name] until this week. She took her last dose of [name] this morning and learned that her mail order pharmacy has not sent her refill yet. They said that they could get it to her in 2-3 days. She has not missed any doses since her last visit. She did stop taking her iron supplements several months ago. 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   Review of Systems: Review of Systems  Constitutional: Negative for chills, diaphoresis, fever, malaise/fatigue and weight loss.  HENT: Negative for sore throat.   Respiratory: Negative for  cough, sputum production and shortness of breath.   Cardiovascular: Negative for chest pain.  Gastrointestinal: Negative for abdominal pain, diarrhea, heartburn, nausea and vomiting.  Genitourinary: Negative for dysuria and frequency.  Musculoskeletal: Negative for joint pain and myalgias.  Skin: Negative for rash.  Neurological: Negative for dizziness and headaches.  Psychiatric/Behavioral: Negative for depression and substance abuse. The patient is not nervous/anxious.     Past Medical History:  Diagnosis Date  . Asthma   . HIV positive ([place])   . PONV (postoperative nausea and vomiting)     Social History  Substance Use Topics  . Smoking status: Never Smoker  . Smokeless tobacco: Never Used  . Alcohol use 0.0 oz/week    0 drink(s) per week     Comment: socially    No family history on file.  Allergies  Allergen Reactions  . Sulfa Antibiotics   . Sulfamethoxazole-Trimethoprim Other (See Comments)    REACTION: Severe Joint pain.  Marland Kitchen Fluticasone-Salmeterol Anxiety    Made jitteriness Made jitteriness    Health Maintenance  Topic Date Due  . TETANUS/TDAP  02/21/1991  . INFLUENZA VACCINE  01/18/2017  . PAP SMEAR  05/17/2019  . HIV Screening  Completed    Objective:  There were no vitals filed for this visit. There is no height or weight on file to calculate BMI.  Physical Exam  Constitutional: She is oriented to person, place, and time.  HENT:  Mouth/Throat: No oropharyngeal exudate.  Eyes: Conjunctivae are normal.  Cardiovascular: Normal rate and regular rhythm.   No murmur heard. Pulmonary/Chest: Effort normal and breath sounds normal.  Abdominal: Soft. She exhibits no mass. There is no tenderness.  Musculoskeletal: Normal range of motion.  Neurological: She is alert and oriented to person, place, and time.  Skin: No rash noted.  Psychiatric: Mood and affect normal.    Lab Results Lab Results  Component Value Date   WBC 6.5 03/16/2017   HGB 8.5 (L)  03/16/2017   HCT 27.6 (L) 03/16/2017   MCV 74.6 (L) 03/16/2017   PLT 437 (H) 03/16/2017    Lab Results  Component Value Date   CREATININE 1.14 (H) 03/16/2017   BUN 13 03/16/2017   NA 140 03/16/2017   K 4.3 03/16/2017   CL 106 03/16/2017   CO2 25 03/16/2017    Lab Results  Component Value Date   ALT 8 03/16/2017   AST 15 03/16/2017   ALKPHOS 65 09/14/2016   BILITOT 0.3 03/16/2017    Lab Results  Component Value Date   CHOL 134 03/16/2017   HDL 71 03/16/2017   LDLCALC 79 07/22/2015   TRIG 46 03/16/2017   CHOLHDL 1.9 03/16/2017   Lab Results  Component Value Date   LABRPR NON REAC 09/14/2016   HIV 1 RNA Quant (copies/mL)  Date Value  03/16/2017 <20 NOT DETECTED  09/14/2016 <20 NOT DETECTED  01/26/2016 <20   CD4 T Cell Abs (/uL)  Date Value  03/16/2017 970  09/14/2016 1,040  01/26/2016 830     Problem List Items Addressed This Visit      High   Human immunodeficiency virus (HIV) disease ([place])    Her HIV infection is under excellent, long-term control. She will probably be out of her [name] for the next 2-3 days but I told her that this will not cause any problem long-term. Her adherence has been excellent. She will follow-up after lab work in one year. She received her influenza vaccination today.      Relevant Orders   CBC   T-helper cell (CD4)- ([facility] only)   Comprehensive metabolic panel   Lipid panel   RPR   HIV 1 RNA quant-no reflex-bld     Medium   Iron deficiency anemia    Her hemoglobin has drifted down over the past year since stopping iron supplements. She will restart them and take them each morning before going to work. She takes her other medications before bedtime.        Unprioritized   Renal insufficiency    She has a very minor bump in her creatinine. She will have this rechecked before her next visit.       Other Visit Diagnoses    Need for immunization against influenza       Relevant Orders   Flu Vaccine QUAD 36+ mos  IM (Completed)        Michel Bickers, MD [facility] for Morris [phone] pager   [phone] cell 03/30/2017, 3:54 PM

## 2017-03-30 NOTE — Assessment & Plan Note (Signed)
Her HIV infection is under excellent, long-term control. She will probably be out of her [name] for the next 2-3 days but I told her that this will not cause any problem long-term. Her adherence has been excellent. She will follow-up after lab work in one year. She received her influenza vaccination today.

## 2017-03-30 NOTE — Assessment & Plan Note (Signed)
Her hemoglobin has drifted down over the past year since stopping iron supplements. She will restart them and take them each morning before going to work. She takes her other medications before bedtime.

## 2017-06-26 ENCOUNTER — Telehealth: Payer: Self-pay | Admitting: *Deleted

## 2017-06-26 DIAGNOSIS — B20 Human immunodeficiency virus [HIV] disease: Secondary | ICD-10-CM

## 2017-06-26 MED ORDER — ABACAVIR-DOLUTEGRAVIR-LAMIVUD 600-50-300 MG PO TABS: 1.0000 | ORAL_TABLET | Freq: Every day | ORAL | 2 refills | Status: DC

## 2017-06-26 MED ORDER — ABACAVIR-DOLUTEGRAVIR-LAMIVUD 600-50-300 MG PO TABS: 1.0000 | ORAL_TABLET | Freq: Every day | ORAL | 0 refills | Status: DC

## 2017-06-26 NOTE — Telephone Encounter (Signed)
Patient called to advise that she has new insurance and needs a new Rx because she has a new pharmacy as well. She has 4 days worth of medication on hand and request 30 day supply to local pharmacy with a 90 day to her mail-order [facility].   Patient also expressed that she had been having trouble with anxiety. She has been having trouble sleeping because she can not "turn it off". She advised it is affecting her focus and work. She would like to try a low dose anti-anxiety medication at least for a short time. Advised her will ask the doctor and give her a call back but will send the Rx to her pharmacy for [name].

## 2017-06-26 NOTE — Telephone Encounter (Signed)
Please schedule Nurah to see Judeen Hammans or Marcie Bal as soon as possible and let her know that it would be best to speak with 1 of our counselors rather than simply start an anti-anxiety medication.

## 2017-06-27 ENCOUNTER — Encounter: Payer: Self-pay | Admitting: *Deleted

## 2017-06-27 NOTE — Telephone Encounter (Signed)
Patient notified she will need to see the counselor and she will think about it and let the office know about scheduling an appointment.

## 2017-07-12 ENCOUNTER — Other Ambulatory Visit: Payer: Self-pay | Admitting: Obstetrics and Gynecology

## 2017-07-12 DIAGNOSIS — Z139 Encounter for screening, unspecified: Secondary | ICD-10-CM

## 2017-07-13 ENCOUNTER — Ambulatory Visit: Payer: 59

## 2017-07-13 ENCOUNTER — Ambulatory Visit
Admission: RE | Admit: 2017-07-13 | Discharge: 2017-07-13 | Disposition: A | Discharge status: Home / Self Care | Payer: Managed Care, Other (non HMO) | Source: Ambulatory Visit | Attending: Obstetrics and Gynecology | Admitting: Obstetrics and Gynecology

## 2017-07-13 DIAGNOSIS — Z139 Encounter for screening, unspecified: Secondary | ICD-10-CM

## 2017-09-07 ENCOUNTER — Other Ambulatory Visit: Payer: Self-pay | Admitting: Obstetrics and Gynecology

## 2017-09-07 DIAGNOSIS — D250 Submucous leiomyoma of uterus: Secondary | ICD-10-CM

## 2017-09-27 ENCOUNTER — Other Ambulatory Visit: Payer: Self-pay | Admitting: Obstetrics and Gynecology

## 2017-09-27 ENCOUNTER — Ambulatory Visit
Admission: RE | Admit: 2017-09-27 | Discharge: 2017-09-27 | Disposition: A | Discharge status: Home / Self Care | Payer: Managed Care, Other (non HMO) | Source: Ambulatory Visit | Attending: Obstetrics and Gynecology | Admitting: Obstetrics and Gynecology

## 2017-09-27 DIAGNOSIS — D250 Submucous leiomyoma of uterus: Secondary | ICD-10-CM

## 2017-09-27 HISTORY — PX: IR RADIOLOGIST EVAL & MGMT: IMG5224

## 2017-09-27 NOTE — Consult Note (Signed)
Chief Complaint: Symptomatic enlarging uterine fibroids, abnormal menstrual bleeding  Referring Physician(s): Cousins,Sheronette  History of Present Illness: Bailey Morris is a 47 y.o. female with a prior history of HPV and HIV.  She has a known history of large uterine fibroids.  She has had surgical myomectomy twice in the past.  Over the last few years she has noted slow interval enlargement of a dominant fundal uterine fibroid.  Her symptoms have continued to progress.  Review of her menstrual cycle performed.  Last menstrual cycle approximately 3-4 weeks ago.  Length of cycle up to 7 days with 3 heavy days and hourly pad and tampon change.  No anterior.  Bleeding.  She has associated iron deficiency anemia.  She currently does not take any birth control pills or hormone replacement therapies.  Last Pap smear 07/21/2017 within normal limits but was positive for HPV.  Endometrial biopsy 3 16,019 was negative for malignancy.  Office ultrasound 08/17/2017 confirms an enlarged uterus measuring up to 15 cm in length with multiple fibroids measuring up to 9 cm.  Past Medical History:  Diagnosis Date  . Asthma   . HIV positive ([place])   . PONV (postoperative nausea and vomiting)     Past Surgical History:  Procedure Laterality Date  . IR RADIOLOGIST EVAL & MGMT  09/27/2017  . MYOMECTOMY    . MYOMECTOMY  06/27/2012   Procedure: MYOMECTOMY;  Surgeon: Frederico Hamman, MD;  Location: [facility];  Service: Gynecology;  Laterality: N/A;  Abdominal    Allergies: Sulfa antibiotics and Advair diskus [fluticasone-salmeterol]  Medications: Prior to Admission medications   Medication Sig Start Date End Date Taking? Authorizing Provider  abacavir-dolutegravir-lamiVUDine ([name]) 600-50-300 MG tablet Take 1 tablet by mouth daily. 06/26/17  Yes Michel Bickers, MD  beclomethasone (QVAR) 40 MCG/ACT inhaler Inhale 2 puffs into the lungs 2 (two) times daily as needed. For wheezing   Yes [provider]  ibuprofen (ADVIL,MOTRIN) 800 MG tablet Take 1 tablet (800 mg total) by mouth every 8 (eight) hours as needed for pain. 06/30/12  Yes Shelly Bombard, MD  levocetirizine (XYZAL) 5 MG tablet Take 5 mg by mouth every evening.   Yes [provider]  montelukast (SINGULAIR) 10 MG tablet Take 10 mg by mouth at bedtime.    Yes [provider]     No family history on file.  Social History   Socioeconomic History  . Marital status: Single    Spouse name: Not on file  . Number of children: Not on file  . Years of education: Not on file  . Highest education level: Not on file  Occupational History  . Not on file  Social Needs  . Financial resource strain: Not on file  . Food insecurity:    Worry: Not on file    Inability: Not on file  . Transportation needs:    Medical: Not on file    Non-medical: Not on file  Tobacco Use  . Smoking status: Never Smoker  . Smokeless tobacco: Never Used  Substance and Sexual Activity  . Alcohol use: Yes    Alcohol/week: 0.0 oz    Comment: socially  . Drug use: No  . Sexual activity: Never    Birth control/protection: None    Comment: declined condoms  Lifestyle  . Physical activity:    Days per week: Not on file    Minutes per session: Not on file  . Stress: Not on  file  Relationships  . Social connections:    Talks on phone: Not on file    Gets together: Not on file    Attends religious service: Not on file    Active member of club or organization: Not on file    Attends meetings of clubs or organizations: Not on file    Relationship status: Not on file  Other Topics Concern  . Not on file  Social History Narrative  . Not on file     Review of Systems: A 12 point ROS discussed and pertinent positives are indicated in the HPI above.  All other systems are negative.  Review of Systems  Vital Signs: BP (!) 152/82 (BP Location: Right Arm, Patient Position: Sitting, Cuff Size: Normal)   Pulse 83    Temp 98 F (36.7 C)   Ht 5' 5.5" (1.664 m)   Wt 182 lb (82.6 kg)   LMP 08/18/2017 (Exact Date)   SpO2 100%   BMI 29.83 kg/m   Physical Exam  Constitutional: She is oriented to person, place, and time. She appears well-developed and well-nourished. No distress.  Eyes: Conjunctivae are normal. No scleral icterus.  Cardiovascular: Normal rate, regular rhythm and intact distal pulses.  Pulmonary/Chest: Effort normal and breath sounds normal.  Abdominal: Soft. Bowel sounds are normal. She exhibits no distension. There is no tenderness.  Midline firm nontender lower abdominal pelvic mass is palpable below the bellybutton compatible with an enlarged fibroid uterus.  Musculoskeletal: Normal range of motion. She exhibits no edema.  Neurological: She is alert and oriented to person, place, and time.  Skin: She is not diaphoretic.  Psychiatric: She has a normal mood and affect. Her behavior is normal.      Imaging: Ir Radiologist Eval & Mgmt  Result Date: 09/27/2017 Please refer to notes tab for details about interventional procedure. (Op Note)   Labs:  CBC: Recent Labs    03/16/17 1208  WBC 6.5  HGB 8.5*  HCT 27.6*  PLT 437*    COAGS: No results for input(s): INR, APTT in the last 8760 hours.  BMP: Recent Labs    03/16/17 1213  NA 140  K 4.3  CL 106  CO2 25  GLUCOSE 82  BUN 13  CALCIUM 9.3  CREATININE 1.14*    LIVER FUNCTION TESTS: Recent Labs    03/16/17 1213  BILITOT 0.3  AST 15  ALT 8  PROT 7.4    TUMOR MARKERS: No results for input(s): AFPTM, CEA, CA199, CHROMGRNA in the last 8760 hours.  Assessment and Plan:  Symptomatic uterine fibroids with abnormal menstrual bleeding and slow interval enlargement.  Treatment options were reviewed.  Our discussion centered around uterine fibroid embolization.  The embolization procedure, risk, benefits and alternatives were reviewed.  The expected goals, recovery, and outcomes were reviewed.  All questions were  addressed.  She has a clear understanding of the procedure.  She would like to continue with the workup which would require pelvic MRI without and with contrast.  This will be scheduled in the next few days.  She will return to review the MRI scan and to determine if she is a candidate for UFE.  Thank you for this interesting consult.  I greatly enjoyed meeting TIMYA TRIMMER and look forward to participating in their care.  A copy of this report was sent to the requesting provider on this date.  Electronically Signed: Greggory Keen 09/27/2017, 9:41 AM   I spent a total of  40  Minutes   in face to face in clinical consultation, greater than 50% of which was counseling/coordinating care for this patient was symptomatically uterine fibroids.

## 2017-10-01 ENCOUNTER — Ambulatory Visit
Admission: RE | Admit: 2017-10-01 | Discharge: 2017-10-01 | Disposition: A | Discharge status: Home / Self Care | Payer: Managed Care, Other (non HMO) | Source: Ambulatory Visit | Attending: Obstetrics and Gynecology | Admitting: Obstetrics and Gynecology

## 2017-10-01 DIAGNOSIS — D250 Submucous leiomyoma of uterus: Secondary | ICD-10-CM

## 2017-10-01 MED ORDER — GADOBENATE DIMEGLUMINE 529 MG/ML IV SOLN
17.0000 mL | Freq: Once | INTRAVENOUS | Status: AC | PRN
  Administered 2017-10-01: 17 mL via INTRAVENOUS

## 2017-10-09 ENCOUNTER — Telehealth: Payer: Self-pay | Admitting: Radiology

## 2017-10-09 NOTE — Telephone Encounter (Signed)
Phoned patient to inform her that Dr Annamaria Boots has reviewed her MR of 10/01/2017.  He feels based on her MR that [place] is not the best option for her and that surgery would be the better option for treatment of her uterine fibroids.    Patient states that she understands and plans to call Dr Garwin Brothers for an app't to discuss surgery.  Jahquez Steffler Riki Rusk, RN 10/09/2017 2:05 PM

## 2018-03-19 ENCOUNTER — Other Ambulatory Visit: Payer: Managed Care, Other (non HMO)

## 2018-03-19 DIAGNOSIS — B20 Human immunodeficiency virus [HIV] disease: Secondary | ICD-10-CM

## 2018-03-21 LAB — HIV-1 RNA QUANT-NO REFLEX-BLD
HIV 1 RNA QUANT: NOT DETECTED {copies}/mL
HIV-1 RNA Quant, Log: <1.3 Log copies/mL

## 2018-03-21 LAB — LIPID PANEL
CHOL/HDL RATIO: 2.3 (calc) (ref <5.0)
Cholesterol: 146 mg/dL (ref <200)
HDL: 63 mg/dL (ref >50)
LDL CHOLESTEROL (CALC): 64 mg/dL
Non-HDL Cholesterol (Calc): 83 mg/dL (calc) (ref <130)
Triglycerides: 104 mg/dL (ref <150)

## 2018-03-21 LAB — CBC
HEMATOCRIT: 29.6 % — AB (ref 35.0–45.0)
HEMOGLOBIN: 9.2 g/dL — AB (ref 11.7–15.5)
MCH: 22 pg — AB (ref 27.0–33.0)
MCHC: 31.1 g/dL — AB (ref 32.0–36.0)
MCV: 70.8 fL — ABNORMAL LOW (ref 80.0–100.0)
MPV: 10.1 fL (ref 7.5–12.5)
Platelets: 489 10*3/uL — ABNORMAL HIGH (ref 140–400)
RBC: 4.18 10*6/uL (ref 3.80–5.10)
RDW: 18.3 % — AB (ref 11.0–15.0)
WBC: 6.2 10*3/uL (ref 3.8–10.8)

## 2018-03-21 LAB — COMPREHENSIVE METABOLIC PANEL
AG Ratio: 1.2 (calc) (ref 1.0–2.5)
ALT: 7 U/L (ref 6–29)
AST: 12 U/L (ref 10–35)
Albumin: 4 g/dL (ref 3.6–5.1)
Alkaline phosphatase (APISO): 81 U/L (ref 33–115)
BUN / CREAT RATIO: 12 (calc) (ref 6–22)
BUN: 13 mg/dL (ref 7–25)
CO2: 29 mmol/L (ref 20–32)
Calcium: 9.5 mg/dL (ref 8.6–10.2)
Chloride: 105 mmol/L (ref 98–110)
Creat: 1.13 mg/dL — ABNORMAL HIGH (ref 0.50–1.10)
GLUCOSE: 91 mg/dL (ref 65–99)
Globulin: 3.3 g/dL (calc) (ref 1.9–3.7)
Potassium: 3.8 mmol/L (ref 3.5–5.3)
Sodium: 141 mmol/L (ref 135–146)
Total Bilirubin: 0.3 mg/dL (ref 0.2–1.2)
Total Protein: 7.3 g/dL (ref 6.1–8.1)

## 2018-03-21 LAB — T-HELPER CELL (CD4) - (RCID CLINIC ONLY)
CD4 T CELL HELPER: 43 % (ref 33–55)
CD4 T Cell Abs: 910 /uL (ref 400–2700)

## 2018-03-21 LAB — RPR: RPR Ser Ql: NONREACTIVE

## 2018-03-22 ENCOUNTER — Other Ambulatory Visit: Payer: Self-pay | Admitting: Internal Medicine

## 2018-03-22 DIAGNOSIS — B20 Human immunodeficiency virus [HIV] disease: Secondary | ICD-10-CM

## 2018-04-02 ENCOUNTER — Ambulatory Visit: Payer: 59 | Admitting: Internal Medicine

## 2018-05-28 ENCOUNTER — Other Ambulatory Visit: Payer: Self-pay

## 2018-05-28 ENCOUNTER — Encounter (HOSPITAL_BASED_OUTPATIENT_CLINIC_OR_DEPARTMENT_OTHER): Payer: Self-pay

## 2018-05-28 ENCOUNTER — Emergency Department (HOSPITAL_BASED_OUTPATIENT_CLINIC_OR_DEPARTMENT_OTHER)
Admission: EM | Admit: 2018-05-28 | Discharge: 2018-05-29 | Disposition: A | Discharge status: Home / Self Care | Payer: Managed Care, Other (non HMO) | Attending: Emergency Medicine | Admitting: Emergency Medicine

## 2018-05-28 DIAGNOSIS — J45909 Unspecified asthma, uncomplicated: Secondary | ICD-10-CM | POA: Diagnosis not present

## 2018-05-28 DIAGNOSIS — R197 Diarrhea, unspecified: Secondary | ICD-10-CM | POA: Diagnosis not present

## 2018-05-28 DIAGNOSIS — B20 Human immunodeficiency virus [HIV] disease: Secondary | ICD-10-CM | POA: Diagnosis not present

## 2018-05-28 DIAGNOSIS — R109 Unspecified abdominal pain: Secondary | ICD-10-CM | POA: Diagnosis not present

## 2018-05-28 DIAGNOSIS — Z79899 Other long term (current) drug therapy: Secondary | ICD-10-CM | POA: Diagnosis not present

## 2018-05-28 LAB — COMPREHENSIVE METABOLIC PANEL
ALBUMIN: 4.5 g/dL (ref 3.5–5.0)
ALT: 14 U/L (ref 0–44)
AST: 24 U/L (ref 15–41)
Alkaline Phosphatase: 60 U/L (ref 38–126)
Anion gap: 9 (ref 5–15)
BUN: 12 mg/dL (ref 6–20)
CHLORIDE: 105 mmol/L (ref 98–111)
CO2: 23 mmol/L (ref 22–32)
Calcium: 9.7 mg/dL (ref 8.9–10.3)
Creatinine, Ser: 1.07 mg/dL — ABNORMAL HIGH (ref 0.44–1.00)
GFR calc Af Amer: >60 mL/min (ref >60)
Glucose, Bld: 152 mg/dL — ABNORMAL HIGH (ref 70–99)
POTASSIUM: 3.7 mmol/L (ref 3.5–5.1)
Sodium: 137 mmol/L (ref 135–145)
Total Bilirubin: 0.5 mg/dL (ref 0.3–1.2)
Total Protein: 8.9 g/dL — ABNORMAL HIGH (ref 6.5–8.1)

## 2018-05-28 LAB — CBC
HEMATOCRIT: 27.9 % — AB (ref 36.0–46.0)
HEMOGLOBIN: 7.9 g/dL — AB (ref 12.0–15.0)
MCH: 20.6 pg — ABNORMAL LOW (ref 26.0–34.0)
MCHC: 28.3 g/dL — AB (ref 30.0–36.0)
MCV: 72.8 fL — AB (ref 80.0–100.0)
Platelets: 507 10*3/uL — ABNORMAL HIGH (ref 150–400)
RBC: 3.83 MIL/uL — ABNORMAL LOW (ref 3.87–5.11)
RDW: 16.1 % — ABNORMAL HIGH (ref 11.5–15.5)
WBC: 8.4 10*3/uL (ref 4.0–10.5)
nRBC: 0 % (ref 0.0–0.2)

## 2018-05-28 LAB — URINALYSIS, ROUTINE W REFLEX MICROSCOPIC
Bilirubin Urine: NEGATIVE
Glucose, UA: NEGATIVE mg/dL
Hgb urine dipstick: NEGATIVE
Ketones, ur: 15 mg/dL — AB
LEUKOCYTES UA: NEGATIVE
NITRITE: NEGATIVE
Protein, ur: 100 mg/dL — AB
SPECIFIC GRAVITY, URINE: 1.01 (ref 1.005–1.030)

## 2018-05-28 LAB — URINALYSIS, MICROSCOPIC (REFLEX): WBC UA: NONE SEEN WBC/hpf (ref 0–5)

## 2018-05-28 LAB — PREGNANCY, URINE: PREG TEST UR: NEGATIVE

## 2018-05-28 LAB — LIPASE, BLOOD: Lipase: 25 U/L (ref 11–51)

## 2018-05-28 MED ORDER — ONDANSETRON HCL 4 MG/2ML IJ SOLN: 4.0000 mg | Freq: Once | INTRAMUSCULAR | Status: DC

## 2018-05-28 MED ORDER — SODIUM CHLORIDE 0.9 % IV BOLUS
1000.0000 mL | Freq: Once | INTRAVENOUS | Status: AC
  Administered 2018-05-28: 1000 mL via INTRAVENOUS

## 2018-05-28 MED ORDER — ONDANSETRON HCL 4 MG/2ML IJ SOLN
4.0000 mg | Freq: Once | INTRAMUSCULAR | Status: AC | PRN
  Administered 2018-05-28: 4 mg via INTRAVENOUS
  Filled 2018-05-28: qty 2

## 2018-05-28 NOTE — ED Triage Notes (Signed)
C/o diarrhea TNTC x today-vomited x 1-NAD-steady gait

## 2018-05-29 ENCOUNTER — Emergency Department (HOSPITAL_BASED_OUTPATIENT_CLINIC_OR_DEPARTMENT_OTHER): Payer: Managed Care, Other (non HMO)

## 2018-05-29 LAB — OCCULT BLOOD X 1 CARD TO LAB, STOOL: FECAL OCCULT BLD: NEGATIVE

## 2018-05-29 MED ORDER — SODIUM CHLORIDE 0.9 % IV BOLUS
1000.0000 mL | Freq: Once | INTRAVENOUS | Status: AC
  Administered 2018-05-29: 1000 mL via INTRAVENOUS

## 2018-05-29 MED ORDER — ONDANSETRON 4 MG PO TBDP: 4.0000 mg | ORAL_TABLET | Freq: Three times a day (TID) | ORAL | 0 refills | Status: DC | PRN

## 2018-05-29 MED ORDER — SODIUM CHLORIDE 0.9 % IV BOLUS: 1000.0000 mL | Freq: Once | INTRAVENOUS | Status: DC

## 2018-05-29 MED ORDER — IOPAMIDOL (ISOVUE-300) INJECTION 61%
100.0000 mL | Freq: Once | INTRAVENOUS | Status: AC | PRN
  Administered 2018-05-29: 100 mL via INTRAVENOUS

## 2018-05-29 NOTE — ED Provider Notes (Signed)
[facility] EMERGENCY DEPARTMENT Provider Note   CSN: [id number] Arrival date & time: 05/28/18  2151     History   Chief Complaint Chief Complaint  Patient presents with  . Diarrhea    HPI Bailey Morris is a 47 y.o. female.  Patient presents with "dehydration" with too numerous to count episodes of loose watery diarrhea today and one episode of vomiting.  Symptoms started after she drove by a gas leak.  She had crampy lower abdominal pain and lightheadedness and generalized weakness.  She denies any blood in her stool.  She denies any fevers.  She denies any pain with urination or blood in the urine.  Has a known large fibroid which has had previous surgeries on.  Still has appendix and gallbladder.  She does have HIV but states her CD4 count is within normal limits and her viral load is undetectable.  She does not have issues with diarrhea in the past.  No chest pain or shortness of breath.  No sick contacts, recent antibiotic use or other country travel.  The history is provided by the patient.  Diarrhea   Associated symptoms include abdominal pain and vomiting. Pertinent negatives include no headaches, no arthralgias and no myalgias.    Past Medical History:  Diagnosis Date  . Asthma   . HIV positive ([place])   . PONV (postoperative nausea and vomiting)     Patient Active Problem List   Diagnosis Date Noted  . Renal insufficiency 03/30/2017  . Iron deficiency anemia 08/06/2015  . PROTEINURIA 07/21/2009  . MYOMA 07/11/2007  . Human immunodeficiency virus (HIV) disease ([place]) 01/10/2007  . VENEREAL WART 08/10/2006  . Depression 08/10/2006  . ASTHMA 08/10/2006  . MENORRHAGIA 08/10/2006  . SCOLIOSIS 08/10/2006  . SHINGLES, HX OF 08/10/2006    Past Surgical History:  Procedure Laterality Date  . IR RADIOLOGIST EVAL & MGMT  09/27/2017  . MYOMECTOMY    . MYOMECTOMY  06/27/2012   Procedure: MYOMECTOMY;  Surgeon: Frederico Hamman, MD;  Location: [facility];   Service: Gynecology;  Laterality: N/A;  Abdominal     OB History   None      Home Medications    Prior to Admission medications   Medication Sig Start Date End Date Taking? Authorizing Provider  beclomethasone (QVAR) 40 MCG/ACT inhaler Inhale 2 puffs into the lungs 2 (two) times daily as needed. For wheezing    [provider]  ibuprofen (ADVIL,MOTRIN) 800 MG tablet Take 1 tablet (800 mg total) by mouth every 8 (eight) hours as needed for pain. 06/30/12   Shelly Bombard, MD  levocetirizine (XYZAL) 5 MG tablet Take 5 mg by mouth every evening.    [provider]  montelukast (SINGULAIR) 10 MG tablet Take 10 mg by mouth at bedtime.     [provider]  [name] [id number] MG tablet TAKE 1 TABLET DAILY 03/22/18   Michel Bickers, MD    Family History No family history on file.  Social History Social History   Tobacco Use  . Smoking status: Never Smoker  . Smokeless tobacco: Never Used  Substance Use Topics  . Alcohol use: Yes    Alcohol/week: 0.1 standard drinks    Comment: socially  . Drug use: No     Allergies   Sulfa antibiotics and Advair diskus [fluticasone-salmeterol]   Review of Systems Review of Systems  Constitutional: Positive for activity change, appetite change and fatigue. Negative for fever.  HENT: Negative for congestion.  Respiratory: Negative for chest tightness and shortness of breath.   Cardiovascular: Negative for chest pain.  Gastrointestinal: Positive for abdominal pain, diarrhea, nausea and vomiting. Negative for blood in stool.  Genitourinary: Negative for dysuria, hematuria and vaginal discharge.  Musculoskeletal: Negative for arthralgias and myalgias.  Neurological: Positive for weakness. Negative for dizziness and headaches.   all other systems are negative except as noted in the HPI and PMH.     Physical Exam Updated Vital Signs BP (!) 150/96 (BP Location: Left Arm)   Pulse 95   Temp 98.1 F (36.7 C)  (Oral)   Resp 18   Ht 5\' 5"  (1.651 m)   Wt 83.5 kg   LMP 05/21/2018   SpO2 100%   BMI 30.62 kg/m   Physical Exam  Constitutional: She is oriented to person, place, and time. She appears well-developed and well-nourished. No distress.  HENT:  Head: Normocephalic and atraumatic.  Mouth/Throat: Oropharynx is clear and moist. No oropharyngeal exudate.  Eyes: Pupils are equal, round, and reactive to light. Conjunctivae and EOM are normal.  Neck: Normal range of motion. Neck supple.  No meningismus.  Cardiovascular: Normal rate, regular rhythm, normal heart sounds and intact distal pulses.  No murmur heard. Pulmonary/Chest: Effort normal and breath sounds normal. No respiratory distress.  Abdominal: Soft. There is tenderness. There is no rebound and no guarding.  Palpable mass consistent with fibroid uterus.  Abdomen is soft and nontender  Genitourinary:  Genitourinary Comments: Chaperon present. No gross blood, no melena.   Musculoskeletal: Normal range of motion. She exhibits no edema or tenderness.  Neurological: She is alert and oriented to person, place, and time. No cranial nerve deficit. She exhibits normal muscle tone. Coordination normal.  No ataxia on finger to nose bilaterally. No pronator drift. 5/5 strength throughout. CN 2-12 intact.Equal grip strength. Sensation intact.   Skin: Skin is warm.  Psychiatric: She has a normal mood and affect. Her behavior is normal.  Nursing note and vitals reviewed.    ED Treatments / Results  Labs (all labs ordered are listed, but only abnormal results are displayed) Labs Reviewed  COMPREHENSIVE METABOLIC PANEL - Abnormal; Notable for the following components:      Result Value   Glucose, Bld 152 (*)    Creatinine, Ser 1.07 (*)    Total Protein 8.9 (*)    All other components within normal limits  CBC - Abnormal; Notable for the following components:   RBC 3.83 (*)    Hemoglobin 7.9 (*)    HCT 27.9 (*)    MCV 72.8 (*)    MCH  20.6 (*)    MCHC 28.3 (*)    RDW 16.1 (*)    Platelets 507 (*)    All other components within normal limits  URINALYSIS, ROUTINE W REFLEX MICROSCOPIC - Abnormal; Notable for the following components:   pH >9.0 (*)    Ketones, ur 15 (*)    Protein, ur 100 (*)    All other components within normal limits  URINALYSIS, MICROSCOPIC (REFLEX) - Abnormal; Notable for the following components:   Bacteria, UA RARE (*)    All other components within normal limits  GASTROINTESTINAL PANEL BY PCR, STOOL (REPLACES STOOL CULTURE)  LIPASE, BLOOD  PREGNANCY, URINE  OCCULT BLOOD X 1 CARD TO LAB, STOOL    EKG None  Radiology Ct Abdomen Pelvis W Contrast  Result Date: 05/29/2018 CLINICAL DATA:  47 year old female with nausea vomiting and diarrhea. EXAM: CT ABDOMEN AND PELVIS WITH CONTRAST  TECHNIQUE: Multidetector CT imaging of the abdomen and pelvis was performed using the standard protocol following bolus administration of intravenous contrast. CONTRAST:  133mL ISOVUE-300 IOPAMIDOL (ISOVUE-300) INJECTION 61% COMPARISON:  Pelvic MRI dated 10/01/2017 FINDINGS: Lower chest: The visualized lung bases are clear. No intra-abdominal free air or free fluid. Hepatobiliary: No focal liver abnormality is seen. No gallstones, gallbladder wall thickening, or biliary dilatation. Pancreas: Unremarkable. No pancreatic ductal dilatation or surrounding inflammatory changes. Spleen: Normal in size without focal abnormality. Adrenals/Urinary Tract: The adrenal glands are unremarkable. Mild bilateral hydronephrosis, likely related to mass effect and compression of the distal ureters by the large myomatous uterus. Multiple bilateral renal hypodense lesions noted some represent cysts and others are too small to characterize. There is symmetric enhancement and excretion of contrast by both kidneys. The urinary bladder is grossly unremarkable. Stomach/Bowel: There is loose stool throughout the colon compatible with diarrheal state.  There is no bowel obstruction or active inflammation. Normal appendix. Vascular/Lymphatic: The abdominal aorta and IVC are grossly unremarkable. No portal venous gas. There is no adenopathy. Reproductive: Large myomatous uterus. Other: None Musculoskeletal: Scoliosis. No acute osseous pathology. IMPRESSION: 1. Diarrheal state. Correlation with clinical exam and stool cultures recommended. No bowel obstruction or active inflammation. Normal appendix. 2. Large myomatous uterus. Electronically Signed   By: Anner Crete M.D.   On: 05/29/2018 01:32    Procedures Procedures (including critical care time)  Medications Ordered in ED Medications  sodium chloride 0.9 % bolus 1,000 mL (1,000 mLs Intravenous New Bag/Given 05/28/18 2351)  sodium chloride 0.9 % bolus 1,000 mL (has no administration in time range)  ondansetron (ZOFRAN) injection 4 mg (4 mg Intravenous Given 05/28/18 2340)     Initial Impression / Assessment and Plan / ED Course  I have reviewed the triage vital signs and the nursing notes.  Pertinent labs & imaging results that were available during my care of the patient were reviewed by me and considered in my medical decision making (see chart for details).    Patient here with diarrhea, vomiting, and abdominal pain.  Slightly anemic but Hemoccult is negative.  Admits to heavy vaginal bleeding from fibroids.  CD4 in September was 910 a viral load is undetectable.  Given IV fluids and symptom control.  Labs are reassuring.  Orthostatics are negative.  Hemoglobin was 8.5 last year and she is 7.9 today.  Hemoccult is negative.  CT scan shows diarrheal state but no other acute pathology.   Patient tolerating p.o.  Resting comfortably.  No diarrhea throughout 6-hour ED stay.  Stool cultures unable to be obtained.  Discussed supportive care at home for likely viral diarrheal illness.  She does not appear to be at risk for opportunistic infections with normal CD4 count.  No  intestinal inflammation seen on CT scan.  Follow-up with PCP.  Return precautions discussed. Final Clinical Impressions(s) / ED Diagnoses   Final diagnoses:  Diarrhea of presumed infectious origin    ED Discharge Orders    None       Messina Kosinski, Annie Main, MD 05/29/18 [id number]

## 2018-05-29 NOTE — Discharge Instructions (Signed)
Keep yourself hydrated.  You may use Imodium as needed for diarrhea up to 3 times a day.  You can follow-up with your doctor for stool cultures if you are able to provide a sample.  Return to the ED if you develop new or worsening symptoms.

## 2018-05-29 NOTE — ED Notes (Signed)
Patient transported to CT 

## 2018-06-22 ENCOUNTER — Other Ambulatory Visit: Payer: Self-pay | Admitting: Internal Medicine

## 2018-06-22 DIAGNOSIS — B20 Human immunodeficiency virus [HIV] disease: Secondary | ICD-10-CM

## 2018-10-22 ENCOUNTER — Other Ambulatory Visit: Payer: Self-pay | Admitting: Internal Medicine

## 2018-10-22 DIAGNOSIS — B20 Human immunodeficiency virus [HIV] disease: Secondary | ICD-10-CM

## 2018-10-31 ENCOUNTER — Telehealth: Payer: Self-pay | Admitting: Internal Medicine

## 2018-10-31 ENCOUNTER — Other Ambulatory Visit: Payer: Self-pay

## 2018-10-31 DIAGNOSIS — B20 Human immunodeficiency virus [HIV] disease: Secondary | ICD-10-CM

## 2018-10-31 MED ORDER — ABACAVIR-DOLUTEGRAVIR-LAMIVUD 600-50-300 MG PO TABS: 1.0000 | ORAL_TABLET | Freq: Every day | ORAL | 0 refills | Status: DC

## 2018-10-31 NOTE — Telephone Encounter (Signed)
COVID-19 Pre-Screening Questions: 10/31/2018 ° °Do you currently have a fever (>100 °F), chills or unexplained body aches?NO °Are you currently experiencing new cough, shortness of breath, sore throat, runny nose? NO °•  °Have you recently travelled outside the state of [place] in the last 14 days? NO °•  °Have you been in contact with someone that is currently pending confirmation of Covid19 testing or has been confirmed to have the Covid19 virus?  NO ° °**If the patient answers NO to ALL questions -  advise the patient to please call the clinic before coming to the office should any symptoms develop.  ° ° ° ° °

## 2018-11-01 ENCOUNTER — Other Ambulatory Visit: Payer: Managed Care, Other (non HMO)

## 2018-11-01 ENCOUNTER — Other Ambulatory Visit: Payer: Self-pay

## 2018-11-01 ENCOUNTER — Other Ambulatory Visit: Payer: Self-pay | Admitting: *Deleted

## 2018-11-01 DIAGNOSIS — B20 Human immunodeficiency virus [HIV] disease: Secondary | ICD-10-CM

## 2018-11-02 ENCOUNTER — Telehealth: Payer: Self-pay | Admitting: *Deleted

## 2018-11-02 ENCOUNTER — Other Ambulatory Visit: Payer: Self-pay | Admitting: Internal Medicine

## 2018-11-02 DIAGNOSIS — B20 Human immunodeficiency virus [HIV] disease: Secondary | ICD-10-CM

## 2018-11-02 DIAGNOSIS — D500 Iron deficiency anemia secondary to blood loss (chronic): Secondary | ICD-10-CM

## 2018-11-02 LAB — T-HELPER CELL (CD4) - (RCID CLINIC ONLY)
CD4 % Helper T Cell: 46 % (ref 33–65)
CD4 T Cell Abs: 1052 /uL (ref 400–1790)

## 2018-11-02 MED ORDER — FERROUS SULFATE 325 (65 FE) MG PO TABS: 325.0000 mg | ORAL_TABLET | Freq: Every day | ORAL | Status: DC

## 2018-11-02 MED ORDER — EMTRICITAB-RILPIVIR-TENOFOV AF 200-25-25 MG PO TABS: 1.0000 | ORAL_TABLET | Freq: Every day | ORAL | 11 refills | Status: DC

## 2018-11-02 NOTE — Telephone Encounter (Signed)
I just spoke to Bailey Morris by phone today.  She is feeling well and says that she may have noted some slight shortness of breath recently but attributed that to her allergies.  Otherwise she is feeling well.  She stopped taking her iron several months ago.  She says that since she could not take it at the same time as [name] she would often forget.  She has been taking her [name] at bedtime.  She is willing to make the switch to [facility] and take it with her evening meal along with her iron. I have sent in prescriptions for both.  She has an upcoming E visit with me.  I will review how she is tolerating the change with her at that time.

## 2018-11-02 NOTE — Telephone Encounter (Signed)
Received critical lab value for patient, hemoglobin 6.0 on 11/01/18. This is a drop from her last labs on 05/28/18. RN reached out to patient. She laughed when given the lab result, states "I'm just my normal - nothing different!" She denies any bleeding, dizziness, or feeling out of breath.  Her last menstrual cycle was normal, ended a few weeks ago.  She states she stopped her iron supplements due to the difficulty in separating it from her [name].  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   Will forward patient's CBC to her gynecologist Dr Garwin Brothers for follow up at patient's request.  Results for ELLEEN, COULIBALY (MRN [id number]) as of 11/02/2018 10:49  Ref. Range 03/19/2018 16:52 05/28/2018 22:15 11/01/2018 16:16  Hemoglobin Latest Ref Range: 11.7 - 15.5 g/dL 9.2 (L) 7.9 (L) 6.0 (LL)   Landis Gandy, RN

## 2018-11-02 NOTE — Telephone Encounter (Signed)
No contraindications to [facility] that I can find. She can take it with her iron at the same time. She will just need to be informed of the need to stay away from any PPIs or acid suppressing agents or let us know if she ever needs them.  She also needs to take [facility] with a full meal.

## 2018-11-02 NOTE — Telephone Encounter (Signed)
I will discuss the possibility of changing [name] to [facility] with Cassie Kuppelweiser, RPH.  I am pretty certain that [name] has never developed resistance to any of her regimens and I do not think she has any current contraindications to [facility].  I think this would allow her to take iron at the same time as [facility].

## 2018-11-02 NOTE — Telephone Encounter (Signed)
Notified patient that you were looking at options, thank you!

## 2018-11-07 LAB — COMPLETE METABOLIC PANEL WITH GFR
AG Ratio: 1.3 (calc) (ref 1.0–2.5)
ALT: 6 U/L (ref 6–29)
AST: 14 U/L (ref 10–35)
Albumin: 4.3 g/dL (ref 3.6–5.1)
Alkaline phosphatase (APISO): 74 U/L (ref 31–125)
BUN/Creatinine Ratio: 9 (calc) (ref 6–22)
BUN: 10 mg/dL (ref 7–25)
CO2: 29 mmol/L (ref 20–32)
Calcium: 9.5 mg/dL (ref 8.6–10.2)
Chloride: 105 mmol/L (ref 98–110)
Creat: 1.17 mg/dL — ABNORMAL HIGH (ref 0.50–1.10)
GFR, Est African American: 65 mL/min/{1.73_m2} (ref >=60)
GFR, Est Non African American: 56 mL/min/{1.73_m2} — ABNORMAL LOW (ref >=60)
Globulin: 3.3 g/dL (calc) (ref 1.9–3.7)
Glucose, Bld: 93 mg/dL (ref 65–99)
Potassium: 3.8 mmol/L (ref 3.5–5.3)
Sodium: 141 mmol/L (ref 135–146)
Total Bilirubin: 0.3 mg/dL (ref 0.2–1.2)
Total Protein: 7.6 g/dL (ref 6.1–8.1)

## 2018-11-07 LAB — CBC WITH DIFFERENTIAL/PLATELET
Absolute Monocytes: 340 cells/uL (ref 200–950)
Basophils Absolute: 38 cells/uL (ref 0–200)
Basophils Relative: 0.6 %
Eosinophils Absolute: 107 cells/uL (ref 15–500)
Eosinophils Relative: 1.7 %
HCT: 21.5 % — ABNORMAL LOW (ref 35.0–45.0)
Hemoglobin: 6 g/dL — CL (ref 11.7–15.5)
Lymphs Abs: 2363 cells/uL (ref 850–3900)
MCH: 17.9 pg — ABNORMAL LOW (ref 27.0–33.0)
MCHC: 27.9 g/dL — ABNORMAL LOW (ref 32.0–36.0)
MCV: 64.2 fL — ABNORMAL LOW (ref 80.0–100.0)
MPV: 9.7 fL (ref 7.5–12.5)
Monocytes Relative: 5.4 %
Neutro Abs: 3452 cells/uL (ref 1500–7800)
Neutrophils Relative %: 54.8 %
Platelets: 455 10*3/uL — ABNORMAL HIGH (ref 140–400)
RBC: 3.35 10*6/uL — ABNORMAL LOW (ref 3.80–5.10)
RDW: 17.1 % — ABNORMAL HIGH (ref 11.0–15.0)
Total Lymphocyte: 37.5 %
WBC: 6.3 10*3/uL (ref 3.8–10.8)

## 2018-11-07 LAB — HIV-1 RNA QUANT-NO REFLEX-BLD
HIV 1 RNA Quant: <20 copies/mL
HIV-1 RNA Quant, Log: <1.3 Log copies/mL

## 2018-11-08 MED ORDER — ABACAVIR-DOLUTEGRAVIR-LAMIVUD 600-50-300 MG PO TABS: 1.0000 | ORAL_TABLET | Freq: Every day | ORAL | 11 refills | Status: DC

## 2018-11-08 NOTE — Telephone Encounter (Signed)
Vivi reviewed her insurance coverage and found Vernell Leep would be much more expensive ($10,000/year and the copay card would only cover $7000).  RN offered other options for copay support, but Jodette would prefer to stay on [name] and keep her iron separate.  She states she has been doing very well at that since her labs and her discussion with Dr Megan Salon. RN sent in refill of [name] to [facility] at [address], cancelled the [place]. Landis Gandy, RN

## 2018-11-08 NOTE — Telephone Encounter (Signed)
Agree and thanks

## 2018-11-08 NOTE — Addendum Note (Signed)
Addended by: Landis Gandy on: 11/08/2018 02:11 PM   Modules accepted: Orders

## 2018-11-13 ENCOUNTER — Ambulatory Visit: Payer: Managed Care, Other (non HMO) | Admitting: Internal Medicine

## 2018-12-03 ENCOUNTER — Other Ambulatory Visit: Payer: Self-pay

## 2018-12-03 ENCOUNTER — Ambulatory Visit (INDEPENDENT_AMBULATORY_CARE_PROVIDER_SITE_OTHER): Payer: Managed Care, Other (non HMO) | Admitting: Internal Medicine

## 2018-12-03 ENCOUNTER — Encounter: Payer: Self-pay | Admitting: Internal Medicine

## 2018-12-03 DIAGNOSIS — B20 Human immunodeficiency virus [HIV] disease: Secondary | ICD-10-CM

## 2018-12-03 NOTE — Progress Notes (Signed)
Virtual Visit via Telephone Note  I connected with Bailey Morris on 12/03/18 at  9:45 AM EDT by telephone and verified that I am speaking with the correct person using two identifiers.  Location: Patient: Work Provider: [facility]   I discussed the limitations, risks, security and privacy concerns of performing an evaluation and management service by telephone and the availability of in person appointments. I also discussed with the patient that there may be a patient responsible charge related to this service. The patient expressed understanding and agreed to proceed.   History of Present Illness: I called and spoke with Bailey Morris this morning.  She has had no problems obtaining, taking or tolerating her [name].  She recalls missing only one dose when she fell asleep before taking it.  After her last visit she did switch to [facility] briefly so that she could take it at the same time as her iron supplements.  Unfortunately she found out that she would have an exorbitant co-pay for [facility] and therefore switch back to [name].  She is working at home through the [employer] pandemic.  Her daughter is currently in daycare Monday through Friday.  She is feeling well.   Observations/Objective: HIV 1 RNA Quant (copies/mL)  Date Value  11/01/2018 <20 NOT DETECTED  03/19/2018 <20 NOT DETECTED  03/16/2017 <20 NOT DETECTED   CD4 T Cell Abs (/uL)  Date Value  11/01/2018 1,052  03/19/2018 910  03/16/2017 970    Assessment and Plan: Her infection remains under excellent, long-term control.  She will continue [name] and follow-up after lab work in 6 months.  Follow Up Instructions: Continue [name] Follow-up after lab work in 6 months   I discussed the assessment and treatment plan with the patient. The patient was provided an opportunity to ask questions and all were answered. The patient agreed with the plan and demonstrated an understanding of the instructions.   The patient was advised to call back  or seek an in-person evaluation if the symptoms worsen or if the condition fails to improve as anticipated.  I provided 14 minutes of non-face-to-face time during this encounter.   Michel Bickers, MD

## 2018-12-09 ENCOUNTER — Other Ambulatory Visit: Payer: Self-pay | Admitting: *Deleted

## 2018-12-09 DIAGNOSIS — Z20822 Contact with and (suspected) exposure to covid-19: Secondary | ICD-10-CM

## 2018-12-16 LAB — NOVEL CORONAVIRUS, NAA: SARS-CoV-2, NAA: NOT DETECTED

## 2019-01-22 ENCOUNTER — Other Ambulatory Visit: Payer: Self-pay | Admitting: Internal Medicine

## 2019-01-22 DIAGNOSIS — B20 Human immunodeficiency virus [HIV] disease: Secondary | ICD-10-CM

## 2019-01-29 ENCOUNTER — Other Ambulatory Visit: Payer: Self-pay | Admitting: *Deleted

## 2019-01-29 DIAGNOSIS — Z20822 Contact with and (suspected) exposure to covid-19: Secondary | ICD-10-CM

## 2019-01-31 LAB — NOVEL CORONAVIRUS, NAA: SARS-CoV-2, NAA: NOT DETECTED

## 2019-03-29 ENCOUNTER — Other Ambulatory Visit: Payer: Self-pay | Admitting: Obstetrics and Gynecology

## 2019-04-03 NOTE — Progress Notes (Signed)
Your procedure is scheduled on  04-08-19   Report to [facility] Main Entrance "A" at 1030 A.M., and check in at the Admitting office.  Call this number if you have problems the morning of surgery:  [phone]  Call [phone] if you have any questions prior to your surgery date Monday-Friday 8am-4pm    Remember:  Do not eat or drink after midnight the night before your surgery  Take these medicines the morning of surgery with A SIP OF WATER: albuterol (VENTOLIN HFA) as needed beclomethasone (QVAR) as needed [name]  7 days prior to surgery STOP taking any Aspirin (unless otherwise instructed by your surgeon), Aleve, Naproxen, Ibuprofen, Motrin, Advil, Goody's, BC's, all herbal medications, fish oil, and all vitamins.    The Morning of Surgery  Do not wear jewelry, make-up or nail polish.  Do not wear lotions, powders, or perfumes/colognes, or deodorant  Do not shave 48 hours prior to surgery.    Do not bring valuables to the hospital.  [facility] is not responsible for any belongings or valuables.  If you are a smoker, DO NOT Smoke 24 hours prior to surgery IF you wear a CPAP at night please bring your mask, tubing, and machine the morning of surgery   Remember that you must have someone to transport you home after your surgery, and remain with you for 24 hours if you are discharged the same day.   Contacts, glasses, hearing aids, dentures or bridgework may not be worn into surgery.    Leave your suitcase in the car.  After surgery it may be brought to your room.  For patients admitted to the hospital, discharge time will be determined by your treatment team.  Patients discharged the day of surgery will not be allowed to drive home.    Special instructions:   [place]- Preparing For Surgery  Before surgery, you can play an important role. Because skin is not sterile, your skin needs to be as free of germs as possible. You can reduce the number of germs on  your skin by washing with CHG (chlorahexidine gluconate) Soap before surgery.  CHG is an antiseptic cleaner which kills germs and bonds with the skin to continue killing germs even after washing.    Oral Hygiene is also important to reduce your risk of infection.  Remember - BRUSH YOUR TEETH THE MORNING OF SURGERY WITH YOUR REGULAR TOOTHPASTE  Please do not use if you have an allergy to CHG or antibacterial soaps. If your skin becomes reddened/irritated stop using the CHG.  Do not shave (including legs and underarms) for at least 48 hours prior to first CHG shower. It is OK to shave your face.  Please follow these instructions carefully.   1. Shower the NIGHT BEFORE SURGERY and the MORNING OF SURGERY with CHG Soap.   2. If you chose to wash your hair, wash your hair first as usual with your normal shampoo.  3. After you shampoo, rinse your hair and body thoroughly to remove the shampoo.  4. Use CHG as you would any other liquid soap. You can apply CHG directly to the skin and wash gently with a scrungie or a clean washcloth.   5. Apply the CHG Soap to your body ONLY FROM THE NECK DOWN.  Do not use on open wounds or open sores. Avoid contact with your eyes, ears, mouth and genitals (private parts). Wash Face and genitals (private parts)  with your normal soap.   6.  Wash thoroughly, paying special attention to the area where your surgery will be performed.  7. Thoroughly rinse your body with warm water from the neck down.  8. DO NOT shower/wash with your normal soap after using and rinsing off the CHG Soap.  9. Pat yourself dry with a CLEAN TOWEL.  10. Wear CLEAN PAJAMAS to bed the night before surgery, wear comfortable clothes the morning of surgery  11. Place CLEAN SHEETS on your bed the night of your first shower and DO NOT SLEEP WITH PETS.  Day of Surgery:  Do not apply any deodorants/lotions. Please shower the morning of surgery with the CHG soap  Please wear clean clothes to  the hospital/surgery center.   Remember to brush your teeth WITH YOUR REGULAR TOOTHPASTE.   Please read over the fact sheets that you were given.

## 2019-04-04 ENCOUNTER — Other Ambulatory Visit (HOSPITAL_COMMUNITY)
Admission: RE | Admit: 2019-04-04 | Discharge: 2019-04-04 | Disposition: A | Discharge status: Home / Self Care | Payer: Managed Care, Other (non HMO) | Source: Ambulatory Visit

## 2019-04-04 ENCOUNTER — Other Ambulatory Visit: Payer: Self-pay

## 2019-04-04 ENCOUNTER — Encounter (HOSPITAL_COMMUNITY)
Admission: RE | Admit: 2019-04-04 | Discharge: 2019-04-04 | Disposition: A | Discharge status: Home / Self Care | Payer: Managed Care, Other (non HMO) | Source: Ambulatory Visit | Attending: Obstetrics and Gynecology | Admitting: Obstetrics and Gynecology

## 2019-04-04 ENCOUNTER — Encounter (HOSPITAL_COMMUNITY): Payer: Self-pay

## 2019-04-04 DIAGNOSIS — Z20828 Contact with and (suspected) exposure to other viral communicable diseases: Secondary | ICD-10-CM | POA: Diagnosis not present

## 2019-04-04 DIAGNOSIS — Z01812 Encounter for preprocedural laboratory examination: Secondary | ICD-10-CM | POA: Insufficient documentation

## 2019-04-04 HISTORY — DX: Anemia, unspecified: D64.9

## 2019-04-04 LAB — BASIC METABOLIC PANEL
Anion gap: 11 (ref 5–15)
BUN: 13 mg/dL (ref 6–20)
CO2: 24 mmol/L (ref 22–32)
Calcium: 9.8 mg/dL (ref 8.9–10.3)
Chloride: 105 mmol/L (ref 98–111)
Creatinine, Ser: 1.07 mg/dL — ABNORMAL HIGH (ref 0.44–1.00)
GFR calc Af Amer: >60 mL/min (ref >60)
GFR calc non Af Amer: >60 mL/min (ref >60)
Glucose, Bld: 105 mg/dL — ABNORMAL HIGH (ref 70–99)
Potassium: 3.4 mmol/L — ABNORMAL LOW (ref 3.5–5.1)
Sodium: 140 mmol/L (ref 135–145)

## 2019-04-04 LAB — CBC
HCT: 34.9 % — ABNORMAL LOW (ref 36.0–46.0)
Hemoglobin: 10.8 g/dL — ABNORMAL LOW (ref 12.0–15.0)
MCH: 24.9 pg — ABNORMAL LOW (ref 26.0–34.0)
MCHC: 30.9 g/dL (ref 30.0–36.0)
MCV: 80.6 fL (ref 80.0–100.0)
Platelets: 410 10*3/uL — ABNORMAL HIGH (ref 150–400)
RBC: 4.33 MIL/uL (ref 3.87–5.11)
RDW: 14 % (ref 11.5–15.5)
WBC: 5.8 10*3/uL (ref 4.0–10.5)
nRBC: 0 % (ref 0.0–0.2)

## 2019-04-04 LAB — TYPE AND SCREEN
ABO/RH(D): O POS
Antibody Screen: NEGATIVE

## 2019-04-04 LAB — ABO/RH: ABO/RH(D): O POS

## 2019-04-04 NOTE — Progress Notes (Signed)
PCP - Dr. Michel Bickers Cardiologist - denies  Chest x-ray - N/A EKG - N/A Stress Test - denies  ECHO - denies Cardiac Cath - denies  Sleep Study - denies CPAP - N/A   Blood Thinner Instructions: N/A Aspirin Instructions: N/A  ERAS Protcol - No PRE-SURGERY Ensure or G2- N/A  COVID TEST- Test pending, 04/04/2019  Anesthesia review: No  Patient denies shortness of breath, fever, cough and chest pain at PAT appointment  All instructions explained to the patient, with a verbal understanding of the material. Patient agrees to go over the instructions while at home for a better understanding. Patient also instructed to self quarantine after being tested for COVID-19. The opportunity to ask questions was provided.

## 2019-04-06 LAB — NOVEL CORONAVIRUS, NAA (HOSP ORDER, SEND-OUT TO REF LAB; TAT 18-24 HRS): SARS-CoV-2, NAA: NOT DETECTED

## 2019-04-08 ENCOUNTER — Encounter (HOSPITAL_COMMUNITY): Payer: Self-pay | Admitting: Certified Registered Nurse Anesthetist

## 2019-04-08 ENCOUNTER — Encounter (HOSPITAL_COMMUNITY)
Admission: RE | Disposition: A | Discharge status: Home / Self Care | Payer: Self-pay | Source: Home / Self Care | Attending: Obstetrics and Gynecology

## 2019-04-08 ENCOUNTER — Inpatient Hospital Stay (HOSPITAL_COMMUNITY)
Admission: RE | Admit: 2019-04-08 | Discharge: 2019-04-08 | DRG: 761 | Disposition: A | Discharge status: Home / Self Care | Payer: Managed Care, Other (non HMO) | Attending: Obstetrics and Gynecology | Admitting: Obstetrics and Gynecology

## 2019-04-08 ENCOUNTER — Other Ambulatory Visit: Payer: Self-pay

## 2019-04-08 ENCOUNTER — Telehealth: Payer: Self-pay

## 2019-04-08 DIAGNOSIS — D259 Leiomyoma of uterus, unspecified: Principal | ICD-10-CM | POA: Diagnosis present

## 2019-04-08 DIAGNOSIS — Z5309 Procedure and treatment not carried out because of other contraindication: Secondary | ICD-10-CM | POA: Diagnosis present

## 2019-04-08 LAB — POCT PREGNANCY, URINE: Preg Test, Ur: NEGATIVE

## 2019-04-08 SURGERY — HYSTERECTOMY, TOTAL, ABDOMINAL, WITH SALPINGECTOMY
Anesthesia: General | Laterality: Bilateral

## 2019-04-08 MED ORDER — VASOPRESSIN 20 UNIT/ML IV SOLN
INTRAVENOUS | Status: AC
  Filled 2019-04-08: qty 2

## 2019-04-08 MED ORDER — SODIUM CHLORIDE (PF) 0.9 % IJ SOLN
INTRAMUSCULAR | Status: AC
  Filled 2019-04-08: qty 100

## 2019-04-08 MED ORDER — BUPIVACAINE HCL (PF) 0.25 % IJ SOLN
INTRAMUSCULAR | Status: AC
  Filled 2019-04-08: qty 30

## 2019-04-08 MED ORDER — FENTANYL CITRATE (PF) 250 MCG/5ML IJ SOLN
INTRAMUSCULAR | Status: AC
  Filled 2019-04-08: qty 5

## 2019-04-08 MED ORDER — MIDAZOLAM HCL 2 MG/2ML IJ SOLN
INTRAMUSCULAR | Status: AC
  Filled 2019-04-08: qty 2

## 2019-04-08 MED ORDER — CEFAZOLIN SODIUM-DEXTROSE 2-4 GM/100ML-% IV SOLN
INTRAVENOUS | Status: AC
  Filled 2019-04-08: qty 100

## 2019-04-08 MED ORDER — CEFAZOLIN SODIUM-DEXTROSE 2-4 GM/100ML-% IV SOLN: 2.0000 g | INTRAVENOUS | Status: DC

## 2019-04-08 MED ORDER — LACTATED RINGERS IV SOLN
INTRAVENOUS | Status: DC
  Administered 2019-04-08: 12:00:00 via INTRAVENOUS

## 2019-04-08 NOTE — Telephone Encounter (Signed)
Agree. Thanks

## 2019-04-08 NOTE — Telephone Encounter (Signed)
Patient returned call and states her blood pressure reading today was 172/110. Patient denies having headaches or feeling dizzy. Advised patient that she should seek care at urgent care/Ed for elevated blood pressure. Also advised patient to work on obtaining PCP. Patient agreed that she will go to urgent care for evaluation. Eugenia Mcalpine

## 2019-04-08 NOTE — Telephone Encounter (Signed)
Received voicemail in triage from patient. She was due to have surgery today for partial hysterectomy but was unable to due to high blood pressure. Return patient's call. No answer and unable to leave VM.  Bailey Morris

## 2019-05-15 ENCOUNTER — Other Ambulatory Visit: Payer: Self-pay | Admitting: Obstetrics and Gynecology

## 2019-05-27 ENCOUNTER — Other Ambulatory Visit: Payer: Self-pay

## 2019-05-27 ENCOUNTER — Other Ambulatory Visit: Payer: Managed Care, Other (non HMO)

## 2019-05-27 DIAGNOSIS — B20 Human immunodeficiency virus [HIV] disease: Secondary | ICD-10-CM

## 2019-05-27 NOTE — Pre-Procedure Instructions (Signed)
Bailey Morris  05/27/2019      [place] [address] Phone: [phone] Fax: [facility] [id number] - [place], [place] - [address] [place] [address] [place] [address] [place] [address] Phone: [phone] Fax: [phone]  [place], [place] [address] [place] [address] Phone: [phone] Fax: [phone]  [facility] - [place], [place] [address] Phone: [phone] Fax: [phone]  [facility] [place], [place] [address] [place] [address] Phone: [phone] Fax: [phone]    Your procedure is scheduled on May 31, 2019.  Report to [facility] Admitting at 1100 AM.  Call this number if you have problems the morning of surgery:  [phone]   Remember:  Do not eat or drink after midnight.  You may drink clear liquids until 1000 AM.  Clear liquids allowed are:                    Water, Juice (non-citric and without pulp), Clear Tea, Black Coffee only and Gatorade    Take these medicines the morning of surgery with A SIP OF WATER  Albuterol inhaler-if needed Amlodipine (norvasc) Flonase nasal spray-if needed  7 days prior to surgery STOP taking any Aspirin (unless otherwise instructed by your surgeon), Aleve, Naproxen, Ibuprofen, Motrin, Advil, Goody's, BC's, all herbal medications, fish oil, and all vitamins    Do not wear jewelry, make-up or nail polish.  Do not wear lotions, powders, or perfumes, or deodorant.  Do not shave 48 hours prior to surgery.  Do not bring valuables to the hospital.  [facility] is not responsible for any belongings or valuables.  Contacts, dentures or bridgework may not be worn into surgery.   Leave your suitcase in the car.  After surgery it may be brought to your room.  For patients admitted to the hospital, discharge time will be determined by your treatment team.  Patients discharged the day of surgery will not be allowed to drive home.    [facility]- Preparing For Surgery  Before surgery, you can play an important role. Because skin is not sterile, your skin needs to be as free of germs as possible. You can reduce the number of germs on your skin by washing with CHG (chlorahexidine gluconate) Soap before surgery.  CHG is an antiseptic cleaner which kills germs and bonds with the skin to continue killing germs even after washing.    Oral Hygiene is also important to reduce your risk of infection.  Remember - BRUSH YOUR TEETH THE MORNING OF SURGERY WITH YOUR REGULAR TOOTHPASTE  Please do not use if you have an allergy to CHG or antibacterial soaps. If your skin becomes reddened/irritated stop using the CHG.  Do not shave (including legs and underarms) for at least 48 hours prior to first CHG shower. It is OK to shave your face.  Please follow these instructions carefully.   1. Shower the NIGHT BEFORE SURGERY and the MORNING OF SURGERY with CHG.   2. If you chose to wash your hair, wash your hair first as usual with your normal shampoo.  3. After you shampoo, rinse your hair and body  thoroughly to remove the shampoo.  4. Use CHG as you would any other liquid soap. You can apply CHG directly to the skin and wash gently with a scrungie or a clean washcloth.   5. Apply the CHG Soap to your body ONLY FROM THE NECK DOWN.  Do not use on open wounds or open sores. Avoid contact with your eyes, ears, mouth and genitals (private parts). Wash Face and genitals (private parts)  with your normal soap.  6. Wash thoroughly, paying special attention to the area where your surgery will be performed.  7. Thoroughly rinse your body with warm water from the neck down.  8. DO NOT  shower/wash with your normal soap after using and rinsing off the CHG Soap.  9. Pat yourself dry with a CLEAN TOWEL.  10. Wear CLEAN PAJAMAS to bed the night before surgery, wear comfortable clothes the morning of surgery  11. Place CLEAN SHEETS on your bed the night of your first shower and DO NOT SLEEP WITH PETS.  Day of Surgery: Shower as above Do not apply any deodorants/lotions.  Please wear clean clothes to the hospital/surgery center.   Remember to brush your teeth WITH YOUR REGULAR TOOTHPASTE.   Please read over the following fact sheets that you were given.

## 2019-05-28 ENCOUNTER — Other Ambulatory Visit: Payer: Self-pay

## 2019-05-28 ENCOUNTER — Other Ambulatory Visit (HOSPITAL_COMMUNITY)
Admission: RE | Admit: 2019-05-28 | Discharge: 2019-05-28 | Disposition: A | Discharge status: Home / Self Care | Payer: Managed Care, Other (non HMO) | Source: Ambulatory Visit | Attending: Obstetrics and Gynecology | Admitting: Obstetrics and Gynecology

## 2019-05-28 ENCOUNTER — Encounter (HOSPITAL_COMMUNITY)
Admission: RE | Admit: 2019-05-28 | Discharge: 2019-05-28 | Disposition: A | Discharge status: Home / Self Care | Payer: Managed Care, Other (non HMO) | Source: Ambulatory Visit | Attending: Obstetrics and Gynecology | Admitting: Obstetrics and Gynecology

## 2019-05-28 ENCOUNTER — Encounter (HOSPITAL_COMMUNITY): Payer: Self-pay

## 2019-05-28 DIAGNOSIS — Z21 Asymptomatic human immunodeficiency virus [HIV] infection status: Secondary | ICD-10-CM | POA: Insufficient documentation

## 2019-05-28 DIAGNOSIS — J45909 Unspecified asthma, uncomplicated: Secondary | ICD-10-CM | POA: Insufficient documentation

## 2019-05-28 DIAGNOSIS — Z6832 Body mass index (BMI) 32.0-32.9, adult: Secondary | ICD-10-CM | POA: Insufficient documentation

## 2019-05-28 DIAGNOSIS — Z01812 Encounter for preprocedural laboratory examination: Secondary | ICD-10-CM | POA: Insufficient documentation

## 2019-05-28 DIAGNOSIS — Z7901 Long term (current) use of anticoagulants: Secondary | ICD-10-CM | POA: Insufficient documentation

## 2019-05-28 DIAGNOSIS — E669 Obesity, unspecified: Secondary | ICD-10-CM | POA: Insufficient documentation

## 2019-05-28 DIAGNOSIS — I10 Essential (primary) hypertension: Secondary | ICD-10-CM | POA: Insufficient documentation

## 2019-05-28 DIAGNOSIS — Z20828 Contact with and (suspected) exposure to other viral communicable diseases: Secondary | ICD-10-CM | POA: Insufficient documentation

## 2019-05-28 DIAGNOSIS — D259 Leiomyoma of uterus, unspecified: Secondary | ICD-10-CM | POA: Insufficient documentation

## 2019-05-28 DIAGNOSIS — Z79899 Other long term (current) drug therapy: Secondary | ICD-10-CM | POA: Insufficient documentation

## 2019-05-28 DIAGNOSIS — D649 Anemia, unspecified: Secondary | ICD-10-CM | POA: Insufficient documentation

## 2019-05-28 HISTORY — DX: Essential (primary) hypertension: I10

## 2019-05-28 LAB — BASIC METABOLIC PANEL
Anion gap: 9 (ref 5–15)
BUN: 9 mg/dL (ref 6–20)
CO2: 24 mmol/L (ref 22–32)
Calcium: 9.2 mg/dL (ref 8.9–10.3)
Chloride: 106 mmol/L (ref 98–111)
Creatinine, Ser: 1.03 mg/dL — ABNORMAL HIGH (ref 0.44–1.00)
GFR calc Af Amer: >60 mL/min (ref >60)
GFR calc non Af Amer: >60 mL/min (ref >60)
Glucose, Bld: 100 mg/dL — ABNORMAL HIGH (ref 70–99)
Potassium: 3.8 mmol/L (ref 3.5–5.1)
Sodium: 139 mmol/L (ref 135–145)

## 2019-05-28 LAB — TYPE AND SCREEN
ABO/RH(D): O POS
Antibody Screen: NEGATIVE

## 2019-05-28 LAB — T-HELPER CELL (CD4) - (RCID CLINIC ONLY)
CD4 % Helper T Cell: 42 % (ref 33–65)
CD4 T Cell Abs: 812 /uL (ref 400–1790)

## 2019-05-28 NOTE — Progress Notes (Signed)
PCP:  Willey Blade, MD Cardiologist:  Denies  EKG:  05/28/19 CXR:  N/A ECHO:  Denies Stress Test:  Denies Cardiac Cath:  Denies  Covid test 05/28/19  Patient denies shortness of breath, fever, cough, and chest pain at PAT appointment.  Patient verbalized understanding of instructions provided today at the PAT appointment.  Patient asked to review instructions at home and day of surgery.

## 2019-05-29 ENCOUNTER — Encounter (HOSPITAL_COMMUNITY): Payer: Self-pay

## 2019-05-29 LAB — NOVEL CORONAVIRUS, NAA (HOSP ORDER, SEND-OUT TO REF LAB; TAT 18-24 HRS): SARS-CoV-2, NAA: NOT DETECTED

## 2019-05-29 NOTE — Anesthesia Preprocedure Evaluation (Addendum)
Anesthesia Evaluation  Patient identified by MRN, date of birth, ID band Patient awake    Reviewed: Allergy & Precautions, H&P , NPO status , Patient's Chart, lab work & pertinent test results, reviewed documented beta blocker date and time   History of Anesthesia Complications (+) PONV and history of anesthetic complications  Airway Mallampati: II  TM Distance: >3 FB Neck ROM: full    Dental no notable dental hx.    Pulmonary asthma ,    Pulmonary exam normal breath sounds clear to auscultation       Cardiovascular Exercise Tolerance: Good hypertension, Pt. on medications  Rhythm:regular Rate:Normal     Neuro/Psych PSYCHIATRIC DISORDERS Depression negative neurological ROS     GI/Hepatic negative GI ROS, Neg liver ROS,   Endo/Other  negative endocrine ROS  Renal/GU Renal disease  negative genitourinary   Musculoskeletal   Abdominal   Peds  Hematology  (+) Blood dyscrasia, anemia , HIV positive   Anesthesia Other Findings   Reproductive/Obstetrics negative OB ROS                            Anesthesia Physical Anesthesia Plan  ASA: III  Anesthesia Plan: General   Post-op Pain Management:    Induction: Intravenous  PONV Risk Score and Plan: 3 and Ondansetron, Dexamethasone, Treatment may vary due to age or medical condition and Midazolam  Airway Management Planned: Oral ETT  Additional Equipment:   Intra-op Plan:   Post-operative Plan: Extubation in OR  Informed Consent: I have reviewed the patients History and Physical, chart, labs and discussed the procedure including the risks, benefits and alternatives for the proposed anesthesia with the patient or authorized representative who has indicated his/her understanding and acceptance.     Dental Advisory Given  Plan Discussed with: CRNA, Anesthesiologist and Surgeon  Anesthesia Plan Comments: (PAT note written 05/29/2019  by Myra Gianotti, PA-C. )      Anesthesia Quick Evaluation

## 2019-05-29 NOTE — Progress Notes (Signed)
Anesthesia Chart Review:  Case: [id number] Date/Time: 05/31/19 1245   Procedure: Exploratory Laparotomy/HYSTERECTOMY ABDOMINAL WITH SALPINGECTOMY (Bilateral ) - Requests 3 hrs.   Anesthesia type: General   Pre-op diagnosis: Symptomatic Uterine Fibroids, Previous Myomectomy   [facility] RO[facility] 07 / [facility]   Surgeon: Servando Salina, MD      DISCUSSION: Patient is a 48 year old female scheduled for the above procedure. Surgery was initially scheduled for 04/08/19, but she was cancelled due to elevated BP of 172/109 and 173/110. BP had been 163/98 and 156/100 on 04/04/19. She is now on amlodipine with BP 05/28/19 of 143/82.   History includes never smoker, post-operative N/V, HTN, asthma, anemia, HIV (on [name]), myomectomy (09/27/17). BMI is consistent with obesity.  BP appears improved since amlodipine started.  She is to take it on the morning of surgery. 05/28/2019 COVID-19 test is in process.  She needs a urine pregnancy test on the day of surgery.   VS: BP (!) 143/82   Pulse 100   Temp 36.9 C (Oral)   Resp 16   Ht 5\' 5"  (1.651 m)   Wt 87.5 kg   LMP 05/20/2019   SpO2 100%   BMI 32.12 kg/m   HR 83 on 05/28/19 EKG.   PROVIDERS: Willey Blade, MD is PCP Michel Bickers, MD is ID   LABS: Labs reviewed: Acceptable for surgery. (all labs ordered are listed, but only abnormal results are displayed)  Labs Reviewed  BASIC METABOLIC PANEL - Abnormal; Notable for the following components:      Result Value   Glucose, Bld 100 (*)    Creatinine, Ser 1.03 (*)    All other components within normal limits  TYPE AND SCREEN   CBC from 05/27/19 showed stable H/H 10.1/32.8, PLT 414K.    EKG: 05/28/19: Normal sinus rhythm Nonspecific T wave abnormality Abnormal ECG Confirmed by Cherlynn Kaiser [phone]) on 05/29/2019 12:44:12 AM   CV: N/A  Past Medical History:  Diagnosis Date  . Anemia   . Asthma   . HIV positive ([place])   . Hypertension   . PONV (postoperative nausea and  vomiting)     Past Surgical History:  Procedure Laterality Date  . IR RADIOLOGIST EVAL & MGMT  09/27/2017  . MYOMECTOMY    . MYOMECTOMY  06/27/2012   Procedure: MYOMECTOMY;  Surgeon: Frederico Hamman, MD;  Location: [facility];  Service: Gynecology;  Laterality: N/A;  Abdominal    MEDICATIONS: . albuterol (VENTOLIN HFA) 108 (90 Base) MCG/ACT inhaler  . amLODipine (NORVASC) 5 MG tablet  . ferrous sulfate 325 (65 FE) MG tablet  . fluticasone (FLONASE) 50 MCG/ACT nasal spray  . levocetirizine (XYZAL) 5 MG tablet  . montelukast (SINGULAIR) 10 MG tablet  . [name] 600-50-300 MG tablet   . ferrous sulfate tablet 325 mg    Myra Gianotti, PA-C Surgical Short Stay/Anesthesiology [facility] Phone [phone] [facility] Phone [phone] 05/29/2019 2:03 PM

## 2019-05-31 ENCOUNTER — Encounter (HOSPITAL_COMMUNITY): Payer: Self-pay | Admitting: Obstetrics and Gynecology

## 2019-05-31 ENCOUNTER — Encounter (HOSPITAL_COMMUNITY)
Admission: RE | Disposition: A | Discharge status: Home / Self Care | Payer: Self-pay | Source: Home / Self Care | Attending: Obstetrics and Gynecology

## 2019-05-31 ENCOUNTER — Inpatient Hospital Stay (HOSPITAL_COMMUNITY): Payer: Managed Care, Other (non HMO) | Admitting: Anesthesiology

## 2019-05-31 ENCOUNTER — Other Ambulatory Visit: Payer: Self-pay

## 2019-05-31 ENCOUNTER — Inpatient Hospital Stay (HOSPITAL_COMMUNITY): Payer: Managed Care, Other (non HMO) | Admitting: Vascular Surgery

## 2019-05-31 ENCOUNTER — Inpatient Hospital Stay (HOSPITAL_COMMUNITY)
Admission: RE | Admit: 2019-05-31 | Discharge: 2019-06-02 | DRG: 743 | Disposition: A | Discharge status: Home / Self Care | Payer: Managed Care, Other (non HMO) | Attending: Obstetrics and Gynecology | Admitting: Obstetrics and Gynecology

## 2019-05-31 DIAGNOSIS — Z21 Asymptomatic human immunodeficiency virus [HIV] infection status: Secondary | ICD-10-CM | POA: Diagnosis present

## 2019-05-31 DIAGNOSIS — D500 Iron deficiency anemia secondary to blood loss (chronic): Secondary | ICD-10-CM | POA: Diagnosis present

## 2019-05-31 DIAGNOSIS — D259 Leiomyoma of uterus, unspecified: Principal | ICD-10-CM | POA: Diagnosis present

## 2019-05-31 DIAGNOSIS — N736 Female pelvic peritoneal adhesions (postinfective): Secondary | ICD-10-CM | POA: Diagnosis present

## 2019-05-31 DIAGNOSIS — I10 Essential (primary) hypertension: Secondary | ICD-10-CM | POA: Diagnosis present

## 2019-05-31 DIAGNOSIS — Z882 Allergy status to sulfonamides status: Secondary | ICD-10-CM

## 2019-05-31 DIAGNOSIS — J45909 Unspecified asthma, uncomplicated: Secondary | ICD-10-CM | POA: Diagnosis present

## 2019-05-31 DIAGNOSIS — Z90710 Acquired absence of both cervix and uterus: Secondary | ICD-10-CM | POA: Diagnosis present

## 2019-05-31 DIAGNOSIS — Z20828 Contact with and (suspected) exposure to other viral communicable diseases: Secondary | ICD-10-CM | POA: Diagnosis present

## 2019-05-31 HISTORY — PX: LYSIS OF ADHESION: SHX5961

## 2019-05-31 HISTORY — PX: OOPHORECTOMY: SHX6387

## 2019-05-31 HISTORY — PX: HYSTERECTOMY ABDOMINAL WITH SALPINGECTOMY: SHX6725

## 2019-05-31 LAB — POCT PREGNANCY, URINE: Preg Test, Ur: NEGATIVE

## 2019-05-31 SURGERY — HYSTERECTOMY, TOTAL, ABDOMINAL, WITH SALPINGECTOMY
Anesthesia: General | Site: Abdomen

## 2019-05-31 MED ORDER — PANTOPRAZOLE SODIUM 40 MG PO TBEC
40.0000 mg | DELAYED_RELEASE_TABLET | Freq: Every day | ORAL | Status: DC
  Administered 2019-05-31 – 2019-06-02 (×3): 40 mg via ORAL
  Filled 2019-05-31 (×3): qty 1

## 2019-05-31 MED ORDER — BUPIVACAINE HCL (PF) 0.25 % IJ SOLN
INTRAMUSCULAR | Status: DC | PRN
  Administered 2019-05-31: 10 mL

## 2019-05-31 MED ORDER — MONTELUKAST SODIUM 10 MG PO TABS
10.0000 mg | ORAL_TABLET | Freq: Every day | ORAL | Status: DC
  Administered 2019-05-31 – 2019-06-01 (×2): 10 mg via ORAL
  Filled 2019-05-31 (×2): qty 1

## 2019-05-31 MED ORDER — MEPERIDINE HCL 25 MG/ML IJ SOLN: 6.2500 mg | INTRAMUSCULAR | Status: DC | PRN

## 2019-05-31 MED ORDER — VASOPRESSIN 20 UNIT/ML IV SOLN
INTRAVENOUS | Status: AC
  Filled 2019-05-31: qty 1

## 2019-05-31 MED ORDER — DEXAMETHASONE SODIUM PHOSPHATE 10 MG/ML IJ SOLN
INTRAMUSCULAR | Status: AC
  Filled 2019-05-31: qty 1

## 2019-05-31 MED ORDER — DIPHENHYDRAMINE HCL 12.5 MG/5ML PO ELIX: 12.5000 mg | ORAL_SOLUTION | Freq: Four times a day (QID) | ORAL | Status: DC | PRN

## 2019-05-31 MED ORDER — AMLODIPINE BESYLATE 5 MG PO TABS
5.0000 mg | ORAL_TABLET | Freq: Every day | ORAL | Status: DC
  Administered 2019-05-31 – 2019-06-01 (×2): 5 mg via ORAL
  Filled 2019-05-31 (×2): qty 1

## 2019-05-31 MED ORDER — DEXAMETHASONE SODIUM PHOSPHATE 10 MG/ML IJ SOLN
INTRAMUSCULAR | Status: DC | PRN
  Administered 2019-05-31: 5 mg via INTRAVENOUS

## 2019-05-31 MED ORDER — FENTANYL CITRATE (PF) 250 MCG/5ML IJ SOLN
INTRAMUSCULAR | Status: DC | PRN
  Administered 2019-05-31 (×7): 50 ug via INTRAVENOUS
  Administered 2019-05-31: 100 ug via INTRAVENOUS
  Administered 2019-05-31 (×2): 50 ug via INTRAVENOUS

## 2019-05-31 MED ORDER — LORATADINE 10 MG PO TABS
10.0000 mg | ORAL_TABLET | Freq: Every day | ORAL | Status: DC
  Administered 2019-05-31 – 2019-06-01 (×2): 10 mg via ORAL
  Filled 2019-05-31 (×2): qty 1

## 2019-05-31 MED ORDER — TRIAMCINOLONE ACETONIDE 40 MG/ML IJ SUSP
INTRAMUSCULAR | Status: AC
  Filled 2019-05-31: qty 5

## 2019-05-31 MED ORDER — FENTANYL CITRATE (PF) 250 MCG/5ML IJ SOLN
INTRAMUSCULAR | Status: AC
  Filled 2019-05-31: qty 5

## 2019-05-31 MED ORDER — PROPOFOL 10 MG/ML IV BOLUS
INTRAVENOUS | Status: DC | PRN
  Administered 2019-05-31: 120 mg via INTRAVENOUS

## 2019-05-31 MED ORDER — SODIUM CHLORIDE (PF) 0.9 % IJ SOLN
INTRAMUSCULAR | Status: AC
  Filled 2019-05-31: qty 100

## 2019-05-31 MED ORDER — BUPIVACAINE HCL (PF) 0.25 % IJ SOLN
INTRAMUSCULAR | Status: AC
  Filled 2019-05-31: qty 30

## 2019-05-31 MED ORDER — HYDROMORPHONE HCL 1 MG/ML IJ SOLN
INTRAMUSCULAR | Status: AC
  Filled 2019-05-31: qty 1

## 2019-05-31 MED ORDER — MENTHOL 3 MG MT LOZG: 1.0000 | LOZENGE | OROMUCOSAL | Status: DC | PRN

## 2019-05-31 MED ORDER — IBUPROFEN 800 MG PO TABS
800.0000 mg | ORAL_TABLET | Freq: Four times a day (QID) | ORAL | Status: DC
  Administered 2019-06-01 – 2019-06-02 (×4): 800 mg via ORAL
  Filled 2019-05-31 (×3): qty 2
  Filled 2019-05-31: qty 1
  Filled 2019-05-31: qty 2

## 2019-05-31 MED ORDER — MIDAZOLAM HCL 2 MG/2ML IJ SOLN
INTRAMUSCULAR | Status: DC | PRN
  Administered 2019-05-31: 2 mg via INTRAVENOUS

## 2019-05-31 MED ORDER — SODIUM CHLORIDE 0.9% FLUSH: 9.0000 mL | INTRAVENOUS | Status: DC | PRN

## 2019-05-31 MED ORDER — HYDROMORPHONE HCL 1 MG/ML IJ SOLN
0.2500 mg | INTRAMUSCULAR | Status: DC | PRN
  Administered 2019-05-31 (×2): 0.5 mg via INTRAVENOUS

## 2019-05-31 MED ORDER — LACTATED RINGERS IV SOLN
INTRAVENOUS | Status: DC
  Administered 2019-05-31 (×2): via INTRAVENOUS

## 2019-05-31 MED ORDER — MIDAZOLAM HCL 2 MG/2ML IJ SOLN
INTRAMUSCULAR | Status: AC
  Filled 2019-05-31: qty 2

## 2019-05-31 MED ORDER — OXYCODONE HCL 5 MG PO TABS
5.0000 mg | ORAL_TABLET | ORAL | Status: DC | PRN
  Administered 2019-06-01 (×2): 5 mg via ORAL
  Filled 2019-05-31: qty 2

## 2019-05-31 MED ORDER — ONDANSETRON HCL 4 MG/2ML IJ SOLN
4.0000 mg | Freq: Four times a day (QID) | INTRAMUSCULAR | Status: DC | PRN
  Administered 2019-05-31: 21:00:00 4 mg via INTRAVENOUS
  Filled 2019-05-31: qty 2

## 2019-05-31 MED ORDER — ABACAVIR-DOLUTEGRAVIR-LAMIVUD 600-50-300 MG PO TABS
1.0000 | ORAL_TABLET | Freq: Every day | ORAL | Status: DC
  Administered 2019-05-31 – 2019-06-01 (×2): 1 via ORAL
  Filled 2019-05-31 (×3): qty 1

## 2019-05-31 MED ORDER — ONDANSETRON HCL 4 MG/2ML IJ SOLN: 4.0000 mg | Freq: Once | INTRAMUSCULAR | Status: DC | PRN

## 2019-05-31 MED ORDER — KETOROLAC TROMETHAMINE 30 MG/ML IJ SOLN
30.0000 mg | Freq: Four times a day (QID) | INTRAMUSCULAR | Status: AC
  Administered 2019-05-31 – 2019-06-01 (×4): 30 mg via INTRAVENOUS
  Filled 2019-05-31 (×4): qty 1

## 2019-05-31 MED ORDER — ROCURONIUM BROMIDE 10 MG/ML (PF) SYRINGE
PREFILLED_SYRINGE | INTRAVENOUS | Status: AC
  Filled 2019-05-31: qty 10

## 2019-05-31 MED ORDER — ONDANSETRON HCL 4 MG/2ML IJ SOLN
INTRAMUSCULAR | Status: AC
  Filled 2019-05-31: qty 2

## 2019-05-31 MED ORDER — BISACODYL 10 MG RE SUPP: 10.0000 mg | Freq: Every day | RECTAL | Status: DC | PRN

## 2019-05-31 MED ORDER — HYDROMORPHONE 1 MG/ML IV SOLN
INTRAVENOUS | Status: DC
  Administered 2019-05-31: 30 mg via INTRAVENOUS
  Administered 2019-06-01: 2.2 mg via INTRAVENOUS
  Administered 2019-06-01: 0 mg via INTRAVENOUS
  Administered 2019-06-01: 0.6 mg via INTRAVENOUS
  Administered 2019-06-01: 0.4 mg via INTRAVENOUS
  Filled 2019-05-31: qty 30

## 2019-05-31 MED ORDER — ONDANSETRON HCL 4 MG PO TABS: 4.0000 mg | ORAL_TABLET | Freq: Four times a day (QID) | ORAL | Status: DC | PRN

## 2019-05-31 MED ORDER — CEFAZOLIN SODIUM-DEXTROSE 2-4 GM/100ML-% IV SOLN
2.0000 g | INTRAVENOUS | Status: AC
  Administered 2019-05-31: 2 g via INTRAVENOUS
  Filled 2019-05-31: qty 100

## 2019-05-31 MED ORDER — ROCURONIUM BROMIDE 10 MG/ML (PF) SYRINGE
PREFILLED_SYRINGE | INTRAVENOUS | Status: DC | PRN
  Administered 2019-05-31: 30 mg via INTRAVENOUS
  Administered 2019-05-31 (×2): 20 mg via INTRAVENOUS
  Administered 2019-05-31: 50 mg via INTRAVENOUS

## 2019-05-31 MED ORDER — LEVOCETIRIZINE DIHYDROCHLORIDE 5 MG PO TABS: 5.0000 mg | ORAL_TABLET | Freq: Every day | ORAL | Status: DC

## 2019-05-31 MED ORDER — PROPOFOL 10 MG/ML IV BOLUS
INTRAVENOUS | Status: AC
  Filled 2019-05-31: qty 20

## 2019-05-31 MED ORDER — ONDANSETRON HCL 4 MG/2ML IJ SOLN: 4.0000 mg | Freq: Four times a day (QID) | INTRAMUSCULAR | Status: DC | PRN

## 2019-05-31 MED ORDER — VASOPRESSIN 20 UNIT/ML IV SOLN
INTRAVENOUS | Status: DC | PRN
  Administered 2019-05-31: 20 [IU] via INTRAMUSCULAR

## 2019-05-31 MED ORDER — FERROUS SULFATE 325 (65 FE) MG PO TABS: 325.0000 mg | ORAL_TABLET | ORAL | Status: DC

## 2019-05-31 MED ORDER — SIMETHICONE 80 MG PO CHEW: 80.0000 mg | CHEWABLE_TABLET | Freq: Four times a day (QID) | ORAL | Status: DC | PRN

## 2019-05-31 MED ORDER — DIPHENHYDRAMINE HCL 50 MG/ML IJ SOLN: 12.5000 mg | Freq: Four times a day (QID) | INTRAMUSCULAR | Status: DC | PRN

## 2019-05-31 MED ORDER — NALOXONE HCL 0.4 MG/ML IJ SOLN: 0.4000 mg | INTRAMUSCULAR | Status: DC | PRN

## 2019-05-31 MED ORDER — METHYLENE BLUE 0.5 % INJ SOLN
INTRAVENOUS | Status: AC
  Filled 2019-05-31: qty 10

## 2019-05-31 MED ORDER — ALBUTEROL SULFATE (2.5 MG/3ML) 0.083% IN NEBU: 3.0000 mL | INHALATION_SOLUTION | RESPIRATORY_TRACT | Status: DC | PRN

## 2019-05-31 MED ORDER — FLUTICASONE PROPIONATE 50 MCG/ACT NA SUSP: 2.0000 | Freq: Every day | NASAL | Status: DC | PRN

## 2019-05-31 MED ORDER — LIDOCAINE 2% (20 MG/ML) 5 ML SYRINGE
INTRAMUSCULAR | Status: DC | PRN
  Administered 2019-05-31: 80 mg via INTRAVENOUS

## 2019-05-31 MED ORDER — DEXTROSE IN LACTATED RINGERS 5 % IV SOLN
INTRAVENOUS | Status: DC
  Administered 2019-05-31: 20:00:00 via INTRAVENOUS

## 2019-05-31 SURGICAL SUPPLY — 47 items
APL SKNCLS STERI-STRIP NONHPOA (GAUZE/BANDAGES/DRESSINGS) ×3
BAG URINE DRAINAGE (UROLOGICAL SUPPLIES) ×4 IMPLANT
BENZOIN TINCTURE PRP APPL 2/3 (GAUZE/BANDAGES/DRESSINGS) ×4 IMPLANT
CANISTER SUCT 3000ML PPV (MISCELLANEOUS) ×4 IMPLANT
CATH FOLEY 3WAY  5CC 16FR (CATHETERS) ×1
CATH FOLEY 3WAY 5CC 16FR (CATHETERS) ×3 IMPLANT
COVER WAND RF STERILE (DRAPES) ×4 IMPLANT
DECANTER SPIKE VIAL GLASS SM (MISCELLANEOUS) ×4 IMPLANT
DRAPE CESAREAN BIRTH W POUCH (DRAPES) ×4 IMPLANT
DRAPE WARM FLUID 44X44 (DRAPES) IMPLANT
DRSG OPSITE POSTOP 4X10 (GAUZE/BANDAGES/DRESSINGS) ×4 IMPLANT
DURAPREP 26ML APPLICATOR (WOUND CARE) ×4 IMPLANT
GAUZE 4X4 16PLY RFD (DISPOSABLE) ×2 IMPLANT
GLOVE BIOGEL PI IND STRL 7.0 (GLOVE) ×9 IMPLANT
GLOVE BIOGEL PI INDICATOR 7.0 (GLOVE) ×3
GLOVE ECLIPSE 6.5 STRL STRAW (GLOVE) ×4 IMPLANT
GOWN STRL REUS W/ TWL LRG LVL3 (GOWN DISPOSABLE) ×9 IMPLANT
GOWN STRL REUS W/TWL LRG LVL3 (GOWN DISPOSABLE) ×12
HEMOSTAT ARISTA ABSORB 3G PWDR (HEMOSTASIS) ×1 IMPLANT
HIBICLENS CHG 4% 4OZ BTL (MISCELLANEOUS) ×4 IMPLANT
KIT TURNOVER KIT B (KITS) ×4 IMPLANT
LIGASURE IMPACT 36 18CM CVD LR (INSTRUMENTS) ×1 IMPLANT
NEEDLE 22X1 1/2 (OR ONLY) (NEEDLE) ×2 IMPLANT
NEEDLE HYPO 22GX1.5 SAFETY (NEEDLE) ×4 IMPLANT
NS IRRIG 1000ML POUR BTL (IV SOLUTION) ×4 IMPLANT
PACK ABDOMINAL GYN (CUSTOM PROCEDURE TRAY) ×4 IMPLANT
PAD ARMBOARD 7.5X6 YLW CONV (MISCELLANEOUS) ×4 IMPLANT
PAD OB MATERNITY 4.3X12.25 (PERSONAL CARE ITEMS) ×4 IMPLANT
PLUG CATH AND CAP STER (CATHETERS) ×4 IMPLANT
SPECIMEN JAR MEDIUM (MISCELLANEOUS) ×4 IMPLANT
SPONGE LAP 18X18 RF (DISPOSABLE) ×10 IMPLANT
STAPLER VISISTAT 35W (STAPLE) ×4 IMPLANT
STRIP CLOSURE SKIN 1/2X4 (GAUZE/BANDAGES/DRESSINGS) ×4 IMPLANT
SUT PLAIN 2 0 XLH (SUTURE) ×1 IMPLANT
SUT PROLENE 0 CT 1 30 (SUTURE) IMPLANT
SUT SILK 2 0 PERMA HAND 18 BK (SUTURE) ×1 IMPLANT
SUT VIC AB 0 CT1 18XCR BRD8 (SUTURE) ×9 IMPLANT
SUT VIC AB 0 CT1 36 (SUTURE) ×16 IMPLANT
SUT VIC AB 0 CT1 8-18 (SUTURE) ×12
SUT VIC AB 2-0 CT1 27 (SUTURE) ×4
SUT VIC AB 2-0 CT1 TAPERPNT 27 (SUTURE) ×3 IMPLANT
SUT VIC AB 3-0 SH 27 (SUTURE) ×4
SUT VIC AB 3-0 SH 27X BRD (SUTURE) IMPLANT
SUT VIC AB 4-0 KS 27 (SUTURE) ×1 IMPLANT
SUT VICRYL 0 TIES 12 18 (SUTURE) ×4 IMPLANT
SYR CONTROL 10ML LL (SYRINGE) ×6 IMPLANT
TOWEL GREEN STERILE FF (TOWEL DISPOSABLE) ×8 IMPLANT

## 2019-05-31 NOTE — H&P (Addendum)
Bailey Morris is an 48 y.o. female G0 SF with symptomatic uterine fibroids presents for definitive surgical management with planned TAH, bilateral salpingectomy. Hx myomectomy x 2. Not a candidate for [place] per IR. EBX benign  proliferative endom. PMH notable for (+) HIV with no detectable viral load  Pertinent Gynecological History: Menses: menorrhagia  Bleeding: menorrhagia Contraception: condoms DES exposure: denies Blood transfusions: none Sexually transmitted diseases:  HIV positive Previous GYN Procedures: myomectomy x 2  Last mammogram: normal Date: 04/01/2019 Last pap: normal date 08/2018   OB History: G0, P0   Menstrual History: Menarche age: n/a Patient's last menstrual period was 05/20/2019.    Past Medical History:  Diagnosis Date  . Anemia   . Asthma   . HIV positive ([place])   . Hypertension   . PONV (postoperative nausea and vomiting)     Past Surgical History:  Procedure Laterality Date  . IR RADIOLOGIST EVAL & MGMT  09/27/2017  . MYOMECTOMY    . MYOMECTOMY  06/27/2012   Procedure: MYOMECTOMY;  Surgeon: Frederico Hamman, MD;  Location: [facility];  Service: Gynecology;  Laterality: N/A;  Abdominal    No family history on file.  Social History:  reports that she has never smoked. She has never used smokeless tobacco. She reports current alcohol use of about 0.1 standard drinks of alcohol per week. She reports that she does not use drugs.  Allergies:  Allergies  Allergen Reactions  . Sulfa Antibiotics Other (See Comments)    Severe joint pain  . Advair Diskus [Fluticasone-Salmeterol] Anxiety    Made jitteriness     No medications prior to admission.    Review of Systems  All other systems reviewed and are negative.   Last menstrual period 05/20/2019. Physical Exam  Constitutional: She is oriented to person, place, and time. She appears well-nourished.  HENT:  Head: Normocephalic and atraumatic.  Eyes: EOM are normal.  Cardiovascular: Regular  rhythm.  Respiratory: Breath sounds normal.  GI:  Soft  Non tender Abdominal mass 2 FB above umb Nl palp liver  Low transverse skin incision  Genitourinary:    Vagina normal.     Genitourinary Comments: Adnexa non palp. Cervix closed deviated posteriorly Uterus 20-22 wk irreg with large post fibroid( cul de sac) Vulva nl   Musculoskeletal:        General: No edema.     Cervical back: Neck supple.  Neurological: She is alert and oriented to person, place, and time.  Skin: Skin is warm and dry.  Psychiatric: She has a normal mood and affect.    CBC Latest Ref Rng & Units 05/27/2019 04/04/2019 11/01/2018  WBC 3.8 - 10.8 Thousand/uL 6.9 5.8 6.3  Hemoglobin 11.7 - 15.5 g/dL 10.1(L) 10.8(L) 6.0(LL)  Hematocrit 35.0 - 45.0 % 32.8(L) 34.9(L) 21.5(L)  Platelets 140 - 400 Thousand/uL 414(H) 410(H) 455(H)   CMP Latest Ref Rng & Units 05/28/2019 04/04/2019 11/01/2018  Glucose 70 - 99 mg/dL 100(H) 105(H) 93  BUN 6 - 20 mg/dL 9 13 10   Creatinine 0.44 - 1.00 mg/dL 1.03(H) 1.07(H) 1.17(H)  Sodium 135 - 145 mmol/L 139 140 141  Potassium 3.5 - 5.1 mmol/L 3.8 3.4(L) 3.8  Chloride 98 - 111 mmol/L 106 105 105  CO2 22 - 32 mmol/L 24 24 29   Calcium 8.9 - 10.3 mg/dL 9.2 9.8 9.5  Total Protein 6.1 - 8.1 g/dL - - 7.6  Total Bilirubin 0.2 - 1.2 mg/dL - - 0.3  Alkaline Phos 38 - 126 U/L - - -  AST 10 - 35 U/L - - 14  ALT 6 - 29 U/L - - 6    No results found.  Assessment/Plan: Symptomatic uterine fibroids IDA  HIV positive HTN P) exp lap, TAH, bilateral salpingectomy. Risk of procedure explained including infection, bleeding, injury to bladder, bowel, ureters, internal scar tissue, possible need for blood transfusion and its risk, ovarian preservation with risk for need for removal of ovary in the future including ovarian cancer, pain, ovarian cyst. All ? answered  Bailey Morris A Bailey Morris 05/31/2019, 7:57 AM   1:15 pm Addendum: I have reexamined pt No change since last visit

## 2019-05-31 NOTE — Transfer of Care (Signed)
Immediate Anesthesia Transfer of Care Note  Patient: Nakyra A Cangemi  Procedure(s) Performed: Exploratory Laparotomy/HYSTERECTOMY ABDOMINAL WITH BILATERAL SALPINGECTOMY (Bilateral Abdomen) LEFT OOPHORECTOMY (Left Abdomen) EXTENSIVE LYSIS OF ADHESIONS (N/A Abdomen)  Patient Location: PACU  Anesthesia Type:General  Level of Consciousness: drowsy and patient cooperative  Airway & Oxygen Therapy: Patient Spontanous Breathing  Post-op Assessment: Report given to RN and Post -op Vital signs reviewed and stable  Post vital signs: Reviewed and stable  Last Vitals:  Vitals Value Taken Time  BP 134/84 05/31/19 1730  Temp    Pulse    Resp 11 05/31/19 1731  SpO2    Vitals shown include unvalidated device data.  Last Pain:  Vitals:   05/31/19 1133  TempSrc: Oral  PainSc: 0-No pain      Patients Stated Pain Goal: 4 ([id number])  Complications: No apparent anesthesia complications

## 2019-05-31 NOTE — Brief Op Note (Signed)
05/31/2019  5:19 PM  PATIENT:  Bailey Morris  48 y.o. female  PRE-OPERATIVE DIAGNOSIS:  Symptomatic Uterine Fibroids, Previous Myomectomy  POST-OPERATIVE DIAGNOSIS:  symptomatic uterine fibroids, previous myomectomy, severe pelvic adhesions  PROCEDURE:  Exp lap, TAH, LSO, right salpingectomy, extensive adhesiolysis( 1hr)  SURGEON:  Surgeon(s) and Role:    * Servando Salina, MD - Primary    * Azucena Fallen, MD - Assisting  PHYSICIAN ASSISTANT:   ASSISTANTS: Azucena Fallen, MD   ANESTHESIA:   general Findings:nl palp liver and kidneys, omental, peritoneal and bowel adhesions to uterus, anterior abdominal wall, ureters nl peristalsing bilaterally. Multiple uterine fibroids( 1100g), ovaries with adhesions, peritubal adhesions EBL:  150 mL   BLOOD ADMINISTERED:none  DRAINS: none   LOCAL MEDICATIONS USED:  MARCAINE     SPECIMEN:  Source of Specimen:  uterus with left tube and ovary, cervix, right tube  DISPOSITION OF SPECIMEN:  PATHOLOGY  COUNTS:  YES  TOURNIQUET:  * No tourniquets in log *  DICTATION: .Other Dictation: Dictation Number [id number] PLAN OF CARE: Admit to inpatient   PATIENT DISPOSITION:  PACU - hemodynamically stable.   Delay start of Pharmacological VTE agent (>24hrs) due to surgical blood loss or risk of bleeding: no

## 2019-05-31 NOTE — Anesthesia Procedure Notes (Signed)
Procedure Name: Intubation Date/Time: 05/31/2019 1:34 PM Performed by: Janace Litten, CRNA Pre-anesthesia Checklist: Patient identified, Emergency Drugs available, Suction available and Patient being monitored Patient Re-evaluated:Patient Re-evaluated prior to induction Oxygen Delivery Method: Circle System Utilized Preoxygenation: Pre-oxygenation with 100% oxygen Induction Type: IV induction Ventilation: Mask ventilation without difficulty Laryngoscope Size: Mac and 3 Grade View: Grade I Tube type: Oral Tube size: 7.0 mm Number of attempts: 1 Airway Equipment and Method: Stylet and Oral airway Placement Confirmation: ETT inserted through vocal cords under direct vision,  positive ETCO2 and breath sounds checked- equal and bilateral Secured at: 22 cm Tube secured with: Tape Dental Injury: Teeth and Oropharynx as per pre-operative assessment

## 2019-06-01 LAB — BASIC METABOLIC PANEL
Anion gap: 12 (ref 5–15)
BUN: 12 mg/dL (ref 6–20)
CO2: 21 mmol/L — ABNORMAL LOW (ref 22–32)
Calcium: 8.9 mg/dL (ref 8.9–10.3)
Chloride: 102 mmol/L (ref 98–111)
Creatinine, Ser: 1.06 mg/dL — ABNORMAL HIGH (ref 0.44–1.00)
GFR calc Af Amer: >60 mL/min (ref >60)
GFR calc non Af Amer: >60 mL/min (ref >60)
Glucose, Bld: 151 mg/dL — ABNORMAL HIGH (ref 70–99)
Potassium: 4.6 mmol/L (ref 3.5–5.1)
Sodium: 135 mmol/L (ref 135–145)

## 2019-06-01 LAB — CBC
HCT: 28.6 % — ABNORMAL LOW (ref 36.0–46.0)
Hemoglobin: 8.8 g/dL — ABNORMAL LOW (ref 12.0–15.0)
MCH: 23.7 pg — ABNORMAL LOW (ref 26.0–34.0)
MCHC: 30.8 g/dL (ref 30.0–36.0)
MCV: 76.9 fL — ABNORMAL LOW (ref 80.0–100.0)
Platelets: 405 10*3/uL — ABNORMAL HIGH (ref 150–400)
RBC: 3.72 MIL/uL — ABNORMAL LOW (ref 3.87–5.11)
RDW: 16.5 % — ABNORMAL HIGH (ref 11.5–15.5)
WBC: 14.4 10*3/uL — ABNORMAL HIGH (ref 4.0–10.5)
nRBC: 0 % (ref 0.0–0.2)

## 2019-06-01 MED ORDER — SODIUM CHLORIDE 0.9 % IV SOLN
510.0000 mg | INTRAVENOUS | Status: DC
  Administered 2019-06-01: 510 mg via INTRAVENOUS
  Filled 2019-06-01: qty 17

## 2019-06-01 NOTE — Plan of Care (Signed)
  Problem: Education: Goal: Knowledge of General Education information will improve Description: Including pain rating scale, medication(s)/side effects and non-pharmacologic comfort measures Outcome: Progressing   Problem: Nutrition: Goal: Adequate nutrition will be maintained Outcome: Progressing   Problem: Activity: Goal: Risk for activity intolerance will decrease Outcome: Progressing   

## 2019-06-01 NOTE — Op Note (Signed)
NAME: Bailey Morris, Bailey Morris MEDICAL RECORD [id number] ACCOUNT [id number] DATE OF BIRTH:Sep 02, 1970 FACILITY: [facility] LOCATION: [facility] PHYSICIAN:Duglas Heier A. Dangela How, MD  OPERATIVE REPORT  DATE OF PROCEDURE:  05/31/2019  PREOPERATIVE DIAGNOSIS:  Symptomatic uterine fibroids, previous myomectomy x2.  PROCEDURE:  Exploratory laparotomy, total abdominal hysterectomy, left salpingo-oophorectomy, right salpingectomy, extensive lysis of adhesions.  POSTOPERATIVE DIAGNOSES:  Extensive pelvic adhesions.  Symptomatic uterine fibroids, previous myomectomy x2.  ANESTHESIA:  General.  SURGEON:  Servando Salina, MD  ASSISTANT:  Angelina Sheriff, MD  DESCRIPTION OF PROCEDURE:  The patient was examined under anesthesia.  This resulted in a uterus that was extending up to the umbilicus.  An indwelling Foley catheter was sterilely placed.  The patient was sterilely prepped and draped in usual fashion.   Marcaine 0.25% was injected along the previous Pfannenstiel skin incision site.  Pfannenstiel skin incision was then made, carried down to the rectus fascia.  Rectus fascia was opened transversely.  Rectus fascia was then bluntly and sharply dissected  off the rectus muscle in a superior and inferior fashion.  The rectus muscle was split in the midline.  The peritoneum was entered bluntly with immediate encounter of adhesions onto the anterior abdominal wall and the uterus.  After much dissection, the  window was noted on the right aspect of the fascial area, and the right ovary with cyst was identified.  On the opposite side, the right round ligament was found.  The uterus was levorotated with the omental adhesion to the anterior abdominal wall, bowel  to the left fundal area and perineum attached to the uterus and the anterior abdominal wall.  With careful dissection lasting an hour with sharp and blunt dissection, the uterus was able to be exteriorized with bowel focal area was noted on the left fundal area,  and this was carefully removed with a small piece of fibroid being taken with the serosa.  A 2-0 silk was used to tag on the small fibroid piece at the site on the bowel.  The left round ligament was identified and also both were severed after suture ligation with 0 Vicryl x2. There was a fibroid anteriorly; and using sharp dissection, the bladder area was displaced inferiorly to further delineate the uterus.  Once the uterus was finally freed to be able to identify the distinct areas to perform the hysterectomy, the posterior leaf of the right broad ligament was opened.  The right fallopian tube was removed with serial clamp, cauterizing and cutting of the mesosalpinx.  The right ovary was pulled up near the fundal area; however, the right uteroovarian ligament was  doubly clamped, cut, and suture ligated x2.  The uterine vessels were then skeletonized on the right.  The bladder then appeared to be displaced inferiorly.  Attention was then placed to the opposite side where the ovary looked better than the right.  It was in a precarious position with ultimately the left IP ligament was bleeding with all the scar tissue.  Decision was then made to remove that ovary.  The fallopian tube was also displaced.  Therefore, the left retroperitoneal space was opened.  The  ovarian vessels isolated and proximally and distally tied with 0 Vicryl sutures.  This allowed for further skeletization and identification of the left uterine vessels.  These fibroids that had appeared to be in the left lower anterior side of the uterus turned out to be left lateral fibroids that had been pulled by adhesions.  Thus, once these lesions in the left lower quadrant were  released, those fibroids and then could be lifted up on the lateral sides, allowing for visualization of the uterine  vessels.  Once this was done, the fundal portion of the uterus was severed from its attachment, allowing for better visualization.  The stump of the cervix  was grasped with tenaculum and the uterosacral ligaments were bilaterally clamped, cut, and suture ligated with 0 Vicryl and the cardinal ligaments were serially clamped and cut until the cervicovaginal junction appeared to be in reach, which was opened on the right side where the angle suture clamp was placed.  The right angle scissors were then  utilized to remove the cervix from its vaginal attachment.  Bleeding figure-of-eight sutures was placed on the angles.  A modified Richardson stitch was placed on the right angle.  The vaginal cuff was oversewn with 0 Vicryl sutures and then was closed  with interrupted 0 Vicryl figure-of-eight sutures.  Additional sutures were placed due to bleeding.  The pedicle on the right respect of the ovary had bleeding, which was regrasped and suture ligated with 3-0 Vicryl suture with good hemostasis noted.  The ureters bilaterally were seen peristalsing deep in the field.  The abdomen was irrigated and suctioned of debris and once good hemostasis was achieved, the uterosacral ligaments were suspended to the angle sutures and the right fallopian tube was suspended with a right round ligament pedicle.  Again, inspection and good hemostasis was noted.  The omentum was brought down further and the areas of defect and/or adhesions were removed and free tied with 0 Vicryl suture.  When good hemostasis was  noted Arista potato starch  was placed overlying the vaginal cuff and the upper abdomen was explored.  Normal liver and normal palpable kidneys were noted.  The parietal peritoneum was closed with 2-0 Vicryl.  The rectus fascia was closed with 0 Vicryl x2.  The  subcutaneous area was irrigated, small bleeders cauterized.  Interrupted 2-0 plain sutures placed and the skin approximated using 4-0 Vicryl subcuticular closure.  Steri-Strips and benzoin was placed.  SPECIMEN:  Uterus with the left tube and ovary, cervix and right fallopian tube all sent to pathology.  ESTIMATED  BLOOD LOSS:  200 mL.  INTRAOPERATIVE FLUIDS:  2300 mL.  URINE OUTPUT:  400 mL.  COUNTS:  Sponge and instrument counts x2 was correct.  COMPLICATIONS:  None.  DISPOSITION:  The patient tolerated the procedure well and was transferred to recovery room in stable condition.  PN/NUANCE  D:06/01/2019 T:06/01/2019 JOB:[id number]

## 2019-06-01 NOTE — Progress Notes (Signed)
Subjective: Patient reports no void as yet. Just ordered breakfast. Pain controlled   Objective: I have reviewed patient's vital signs.  vital signs, intake and output and labs. intraop findings reviewed with pt. Pictures of surgery given to her  Vitals:   06/01/19 0400 06/01/19 0521  BP:  111/65  Pulse:  (!) 102  Resp: 16 15  Temp:  98.2 F (36.8 C)  SpO2: 96% 96%   I/O last 3 completed shifts: In: 3570.6 [P.O.:680; I.V.:2890.6] Out: 2600 [Urine:2450; Blood:150] No intake/output data recorded.  Lab Results  Component Value Date   WBC 14.4 (H) 06/01/2019   HGB 8.8 (L) 06/01/2019   HCT 28.6 (L) 06/01/2019   MCV 76.9 (L) 06/01/2019   PLT 405 (H) 06/01/2019   Lab Results  Component Value Date   CREATININE 1.06 (H) 06/01/2019    EXAM General: alert, cooperative and no distress Resp: clear to auscultation bilaterally Cardio: regular rate and rhythm and grade 3/6 SEM GI: incision: clean, dry and intact and soft distended rare bowel sounds Extremities: no edema, redness or tenderness in the calves or thighs Vaginal Bleeding: none  Assessment: s/p Procedure(s): Exploratory Laparotomy/HYSTERECTOMY ABDOMINAL WITH BILATERAL SALPINGECTOMY LEFT OOPHORECTOMY EXTENSIVE LYSIS OF ADHESIONS: stable and anemia IDA related to chronic blood loss  Plan: Advance diet Encourage ambulation Advance to PO medication  IV iron infusion today   LOS: 1 day    Marvene Staff, MD 06/01/2019 7:16 AM    06/01/2019, 7:16 AM

## 2019-06-01 NOTE — Anesthesia Postprocedure Evaluation (Signed)
Anesthesia Post Note  Patient: Bailey Morris  Procedure(s) Performed: Exploratory Laparotomy/HYSTERECTOMY ABDOMINAL WITH BILATERAL SALPINGECTOMY (Bilateral Abdomen) LEFT OOPHORECTOMY (Left Abdomen) EXTENSIVE LYSIS OF ADHESIONS (N/A Abdomen)     Patient location during evaluation: PACU Anesthesia Type: General Level of consciousness: awake and alert Pain management: pain level controlled Vital Signs Assessment: post-procedure vital signs reviewed and stable Respiratory status: spontaneous breathing, nonlabored ventilation and respiratory function stable Cardiovascular status: blood pressure returned to baseline and stable Postop Assessment: no apparent nausea or vomiting Anesthetic complications: no    Last Vitals:  Vitals:   06/01/19 0813 06/01/19 1143  BP: 134/78 129/71  Pulse: 89 (!) 101  Resp: 18 16  Temp: 36.6 C 36.9 C  SpO2: 100% 100%    Last Pain:  Vitals:   06/01/19 1143  TempSrc: Oral  PainSc:                  Audry Pili

## 2019-06-01 NOTE — Progress Notes (Signed)
Dilaudid waste 40ml, witness by Lindsay,CN

## 2019-06-02 MED ORDER — IBUPROFEN 800 MG PO TABS: 800.0000 mg | ORAL_TABLET | Freq: Four times a day (QID) | ORAL | 5 refills | Status: AC | PRN

## 2019-06-02 MED ORDER — OXYCODONE HCL 5 MG PO TABS: 5.0000 mg | ORAL_TABLET | ORAL | 0 refills | Status: AC | PRN

## 2019-06-02 NOTE — Progress Notes (Signed)
Subjective: Patient reports tolerating PO, + flatus, and no problems voiding.    Objective: I have reviewed patient's vital signs.  vital signs and medications. Vitals:   06/02/19 0519 06/02/19 0752  BP: 112/64 116/61  Pulse: 88 88  Resp: 16   Temp: 98.5 F (36.9 C) 98.4 F (36.9 C)  SpO2: 100% 99%   I/O last 3 completed shifts: In: 2517.6 [P.O.:680; I.V.:1720.6; IV Piggyback:117] Out: 1750 [Urine:1750] No intake/output data recorded.  Lab Results  Component Value Date   WBC 14.4 (H) 06/01/2019   HGB 8.8 (L) 06/01/2019   HCT 28.6 (L) 06/01/2019   MCV 76.9 (L) 06/01/2019   PLT 405 (H) 06/01/2019   Lab Results  Component Value Date   CREATININE 1.06 (H) 06/01/2019    EXAM General: alert, cooperative, and no distress Resp: clear to auscultation bilaterally Cardio: RRR grade 3/6 SEM GI: soft obese active BS primary dressing d/c/i Extremities: no edema, redness or tenderness in the calves or thighs Vaginal Bleeding: none  Assessment: s/p Procedure(s): Exploratory Laparotomy/HYSTERECTOMY ABDOMINAL WITH BILATERAL SALPINGECTOMY LEFT OOPHORECTOMY EXTENSIVE LYSIS OF ADHESIONS: stable, progressing well, tolerating diet, and anemia S/p IV iron infusion  Plan: Encourage ambulation Discontinue IV fluids Discharge home D/c instructions reviewed. 2nd IV iron infusion one week F/u 4 wk Scripts sent  LOS: 2 days    Marvene Staff, MD 06/02/2019 11:29 AM    06/02/2019, 11:29 AM

## 2019-06-02 NOTE — Progress Notes (Signed)
Patient discharged home in stable condition. Verbalizes understanding of all discharge instructions, including home medications and follow up appointments. 

## 2019-06-02 NOTE — Discharge Summary (Signed)
Physician Discharge Summary  Patient ID: Bailey Morris MRN: [id number] DOB/AGE: 47-Jul-1972 47 y.o.  Admit date: 05/31/2019 Discharge date: 06/02/2019  Admission Diagnoses: symptomatic uterine fibroids Previous myomectomy, IDA Discharge Diagnoses:  symptomatic uterine fibroids, previous myomectomy, severe pelvic adhesions, IDA Active Problems:   Uterine fibroid   Status post total hysterectomy HIV positive Chronic HTN  Discharged Condition: stable  Hospital Course: pt underwent exp lap, TAH, LSO, right salpingectomy, extensive LOA. Uncomplicated postoperative course. Pt was given IV iron supplementation  Consults: None  Significant Diagnostic Studies: labs:  CBC Latest Ref Rng & Units 06/01/2019 05/27/2019 04/04/2019  WBC 4.0 - 10.5 K/uL 14.4(H) 6.9 5.8  Hemoglobin 12.0 - 15.0 g/dL 8.8(L) 10.1(L) 10.8(L)  Hematocrit 36.0 - 46.0 % 28.6(L) 32.8(L) 34.9(L)  Platelets 150 - 400 K/uL 405(H) 414(H) 410(H)   BMP Latest Ref Rng & Units 06/01/2019 05/28/2019 04/04/2019  Glucose 70 - 99 mg/dL 151(H) 100(H) 105(H)  BUN 6 - 20 mg/dL 12 9 13   Creatinine 0.44 - 1.00 mg/dL 1.06(H) 1.03(H) 1.07(H)  BUN/Creat Ratio 6 - 22 (calc) - - -  Sodium 135 - 145 mmol/L 135 139 140  Potassium 3.5 - 5.1 mmol/L 4.6 3.8 3.4(L)  Chloride 98 - 111 mmol/L 102 106 105  CO2 22 - 32 mmol/L 21(L) 24 24  Calcium 8.9 - 10.3 mg/dL 8.9 9.2 9.8     Treatments: surgery: exp lap, TAH, LSO, right salpingectomy, extensive LOA  Discharge Exam: Blood pressure 116/61, pulse 88, temperature 98.4 F (36.9 C), temperature source Oral, resp. rate 16, height 5\' 5"  (1.651 m), weight 87.5 kg, last menstrual period 05/20/2019, SpO2 99 %. General appearance: alert, cooperative, and no distress Back: no tenderness to percussion or palpation Resp: clear to auscultation bilaterally Cardio: regular rate and rhythm and grade 3/6 SEM GI: soft  active BS nondistended Pelvic: deferred Incision/Wound:  clean/dry/intact  Disposition: Discharge disposition: 01-Home or Self Care       Discharge Instructions     Call MD for:  persistant nausea and vomiting   Complete by: As directed    Call MD for:  severe uncontrolled pain   Complete by: As directed    Call MD for:  temperature >100.4   Complete by: As directed    Diet - low sodium heart healthy   Complete by: As directed    Discharge instructions   Complete by: As directed    Call if temperature greater than equal to 100.4, nothing per vagina for 4-6 weeks or severe nausea vomiting, increased incisional pain , drainage or redness in the incision site, no straining with bowel movements, showers no bath   May walk up steps   Complete by: As directed       Allergies as of 06/02/2019       Reactions   Sulfa Antibiotics Other (See Comments)   Severe joint pain   Advair Diskus [fluticasone-salmeterol] Anxiety   Made jitteriness        Medication List     TAKE these medications    albuterol 108 (90 Base) MCG/ACT inhaler Commonly known as: VENTOLIN HFA Inhale 1-2 puffs into the lungs every 4 (four) hours as needed for wheezing or shortness of breath.   amLODipine 5 MG tablet Commonly known as: NORVASC Take 5 mg by mouth at bedtime.   ferrous sulfate 325 (65 FE) MG tablet Take 325 mg by mouth 2 (two) times a week.   fluticasone 50 MCG/ACT nasal spray Commonly known as: FLONASE Place 2 sprays into  both nostrils daily as needed for allergies or rhinitis.   ibuprofen 800 MG tablet Commonly known as: ADVIL Take 1 tablet (800 mg total) by mouth every 6 (six) hours as needed.   levocetirizine 5 MG tablet Commonly known as: XYZAL Take 5 mg by mouth at bedtime.   montelukast 10 MG tablet Commonly known as: SINGULAIR Take 10 mg by mouth at bedtime.   oxyCODONE 5 MG immediate release tablet Commonly known as: Oxy IR/ROXICODONE Take 1-2 tablets (5-10 mg total) by mouth every 4 (four) hours as needed for up to 7  days for moderate pain.   [name] 600-50-300 MG tablet Generic drug: abacavir-dolutegravir-lamiVUDine TAKE 1 TABLET BY MOUTH DAILY What changed: when to take this       Follow-up Information     Servando Salina, MD Follow up in 4 week(s).   Specialty: Obstetrics and Gynecology Contact information: [address] [place] [address] [phone]            Signed: Alanda Slim A Vane Yapp 06/02/2019, 11:38 AM

## 2019-06-03 ENCOUNTER — Other Ambulatory Visit: Payer: Self-pay | Admitting: Obstetrics and Gynecology

## 2019-06-03 LAB — HIV-1 RNA QUANT-NO REFLEX-BLD
HIV 1 RNA Quant: <20 copies/mL
HIV-1 RNA Quant, Log: <1.3 Log copies/mL

## 2019-06-03 LAB — CBC
HCT: 32.8 % — ABNORMAL LOW (ref 35.0–45.0)
Hemoglobin: 10.1 g/dL — ABNORMAL LOW (ref 11.7–15.5)
MCH: 23.4 pg — ABNORMAL LOW (ref 27.0–33.0)
MCHC: 30.8 g/dL — ABNORMAL LOW (ref 32.0–36.0)
MCV: 75.9 fL — ABNORMAL LOW (ref 80.0–100.0)
MPV: 10.3 fL (ref 7.5–12.5)
Platelets: 414 10*3/uL — ABNORMAL HIGH (ref 140–400)
RBC: 4.32 10*6/uL (ref 3.80–5.10)
RDW: 15.4 % — ABNORMAL HIGH (ref 11.0–15.0)
WBC: 6.9 10*3/uL (ref 3.8–10.8)

## 2019-06-04 LAB — SURGICAL PATHOLOGY

## 2019-06-10 ENCOUNTER — Encounter: Payer: Self-pay | Admitting: Internal Medicine

## 2019-06-10 ENCOUNTER — Ambulatory Visit (HOSPITAL_COMMUNITY)
Admission: RE | Admit: 2019-06-10 | Discharge: 2019-06-10 | Disposition: A | Discharge status: Home / Self Care | Payer: Managed Care, Other (non HMO) | Source: Ambulatory Visit | Attending: Internal Medicine | Admitting: Internal Medicine

## 2019-06-10 ENCOUNTER — Ambulatory Visit (INDEPENDENT_AMBULATORY_CARE_PROVIDER_SITE_OTHER): Payer: Managed Care, Other (non HMO) | Admitting: Internal Medicine

## 2019-06-10 ENCOUNTER — Other Ambulatory Visit: Payer: Self-pay

## 2019-06-10 DIAGNOSIS — B20 Human immunodeficiency virus [HIV] disease: Secondary | ICD-10-CM

## 2019-06-10 DIAGNOSIS — D509 Iron deficiency anemia, unspecified: Secondary | ICD-10-CM | POA: Insufficient documentation

## 2019-06-10 MED ORDER — SODIUM CHLORIDE 0.9 % IV SOLN
INTRAVENOUS | Status: DC | PRN
  Administered 2019-06-10: 250 mL via INTRAVENOUS

## 2019-06-10 MED ORDER — SODIUM CHLORIDE 0.9 % IV SOLN
510.0000 mg | Freq: Once | INTRAVENOUS | Status: AC
  Administered 2019-06-10: 510 mg via INTRAVENOUS
  Filled 2019-06-10: qty 17

## 2019-06-10 NOTE — Discharge Instructions (Signed)

## 2019-06-10 NOTE — Progress Notes (Signed)
Virtual Visit via Telephone Note  I connected with Bailey Morris on 06/10/19 at  1:45 PM EST by telephone and verified that I am speaking with the correct person using two identifiers.  Location: Patient: Home Provider: [facility]   I discussed the limitations, risks, security and privacy concerns of performing an evaluation and management service by telephone and the availability of in person appointments. I also discussed with the patient that there may be a patient responsible charge related to this service. The patient expressed understanding and agreed to proceed.   History of Present Illness: I called and spoke with Bailey Morris today.  She has had no problems obtaining, taking or tolerating her [name] and rarely, if ever, misses a dose.  She underwent a total hysterectomy 2 weeks ago because of her ongoing problems with iron deficient anemia.  She is also had 2 iron infusions.  She has had no problems recovering from surgery.  She has been working from home during the pandemic.  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  She is looking forward to Christmas with her 22-year-old daughter and her parents.  She had a flu shot in October.   Observations/Objective: HIV 1 RNA Quant (copies/mL)  Date Value  05/27/2019 <20 NOT DETECTED  11/01/2018 <20 NOT DETECTED  03/19/2018 <20 NOT DETECTED   CD4 T Cell Abs (/uL)  Date Value  05/27/2019 812  11/01/2018 1,052  03/19/2018 910    Assessment and Plan: Her infection remains under excellent, long-term control.  She will continue [name] and follow-up here after lab work in 1 year.  Follow Up Instructions: Continue [name]    I discussed the assessment and treatment plan with the patient. The patient was provided an opportunity to ask questions and all were answered. The patient agreed with the plan and demonstrated an understanding of the instructions.   The patient was advised to call back or seek an in-person evaluation if  the symptoms worsen or if the condition fails to improve as anticipated.  I provided 14 minutes of non-face-to-face time during this encounter.   Michel Bickers, MD

## 2019-06-10 NOTE — Progress Notes (Signed)
Patient received IV Feraheme as ordered by Servando Salina MD. Observed for at least 30 minutes post infusion.Tolerated well, vitals stable, discharge instructions given, verbalized understanding. Patient alert, oriented and ambulatory at the time of discharge.

## 2019-08-30 ENCOUNTER — Other Ambulatory Visit: Payer: Self-pay

## 2019-08-30 DIAGNOSIS — B20 Human immunodeficiency virus [HIV] disease: Secondary | ICD-10-CM

## 2019-08-30 MED ORDER — TRIUMEQ 600-50-300 MG PO TABS: 1.0000 | ORAL_TABLET | Freq: Every day | ORAL | 2 refills | Status: DC

## 2020-04-10 ENCOUNTER — Other Ambulatory Visit: Payer: Self-pay | Admitting: Obstetrics and Gynecology

## 2020-04-10 DIAGNOSIS — Z1231 Encounter for screening mammogram for malignant neoplasm of breast: Secondary | ICD-10-CM

## 2020-05-12 ENCOUNTER — Ambulatory Visit
Admission: RE | Admit: 2020-05-12 | Discharge: 2020-05-12 | Disposition: A | Discharge status: Home / Self Care | Payer: Managed Care, Other (non HMO) | Source: Ambulatory Visit | Attending: Obstetrics and Gynecology | Admitting: Obstetrics and Gynecology

## 2020-05-12 ENCOUNTER — Other Ambulatory Visit: Payer: Self-pay

## 2020-05-12 DIAGNOSIS — Z1231 Encounter for screening mammogram for malignant neoplasm of breast: Secondary | ICD-10-CM

## 2020-05-19 ENCOUNTER — Other Ambulatory Visit: Payer: Self-pay | Admitting: Obstetrics and Gynecology

## 2020-05-19 DIAGNOSIS — R928 Other abnormal and inconclusive findings on diagnostic imaging of breast: Secondary | ICD-10-CM

## 2020-05-20 ENCOUNTER — Other Ambulatory Visit: Payer: Managed Care, Other (non HMO)

## 2020-05-20 ENCOUNTER — Other Ambulatory Visit: Payer: Self-pay

## 2020-05-20 DIAGNOSIS — B20 Human immunodeficiency virus [HIV] disease: Secondary | ICD-10-CM

## 2020-05-21 LAB — T-HELPER CELL (CD4) - (RCID CLINIC ONLY)
CD4 % Helper T Cell: 43 % (ref 33–65)
CD4 T Cell Abs: 879 /uL (ref 400–1790)

## 2020-05-22 LAB — COMPREHENSIVE METABOLIC PANEL
AG Ratio: 1.5 (calc) (ref 1.0–2.5)
ALT: 12 U/L (ref 6–29)
AST: 14 U/L (ref 10–35)
Albumin: 4.6 g/dL (ref 3.6–5.1)
Alkaline phosphatase (APISO): 62 U/L (ref 31–125)
BUN: 16 mg/dL (ref 7–25)
CO2: 26 mmol/L (ref 20–32)
Calcium: 9.8 mg/dL (ref 8.6–10.2)
Chloride: 103 mmol/L (ref 98–110)
Creat: 1.06 mg/dL (ref 0.50–1.10)
Globulin: 3.1 g/dL (calc) (ref 1.9–3.7)
Glucose, Bld: 73 mg/dL (ref 65–99)
Potassium: 3.9 mmol/L (ref 3.5–5.3)
Sodium: 138 mmol/L (ref 135–146)
Total Bilirubin: 0.4 mg/dL (ref 0.2–1.2)
Total Protein: 7.7 g/dL (ref 6.1–8.1)

## 2020-05-22 LAB — CBC
HCT: 43.3 % (ref 35.0–45.0)
Hemoglobin: 15 g/dL (ref 11.7–15.5)
MCH: 30.5 pg (ref 27.0–33.0)
MCHC: 34.6 g/dL (ref 32.0–36.0)
MCV: 88.2 fL (ref 80.0–100.0)
MPV: 10.9 fL (ref 7.5–12.5)
Platelets: 304 10*3/uL (ref 140–400)
RBC: 4.91 10*6/uL (ref 3.80–5.10)
RDW: 13.2 % (ref 11.0–15.0)
WBC: 6.3 10*3/uL (ref 3.8–10.8)

## 2020-05-22 LAB — LIPID PANEL
Cholesterol: 143 mg/dL (ref <200)
HDL: 55 mg/dL (ref >=50)
LDL Cholesterol (Calc): 74 mg/dL (calc)
Non-HDL Cholesterol (Calc): 88 mg/dL (calc) (ref <130)
Total CHOL/HDL Ratio: 2.6 (calc) (ref <5.0)
Triglycerides: 63 mg/dL (ref <150)

## 2020-05-22 LAB — HIV-1 RNA QUANT-NO REFLEX-BLD
HIV 1 RNA Quant: <20 Copies/mL
HIV-1 RNA Quant, Log: <1.3 Log cps/mL

## 2020-05-22 LAB — RPR: RPR Ser Ql: NONREACTIVE

## 2020-06-01 ENCOUNTER — Other Ambulatory Visit: Payer: Self-pay

## 2020-06-01 ENCOUNTER — Ambulatory Visit
Admission: RE | Admit: 2020-06-01 | Discharge: 2020-06-01 | Disposition: A | Discharge status: Home / Self Care | Payer: Managed Care, Other (non HMO) | Source: Ambulatory Visit | Attending: Obstetrics and Gynecology | Admitting: Obstetrics and Gynecology

## 2020-06-01 DIAGNOSIS — R928 Other abnormal and inconclusive findings on diagnostic imaging of breast: Secondary | ICD-10-CM

## 2020-06-08 ENCOUNTER — Telehealth: Payer: Self-pay | Admitting: *Deleted

## 2020-06-08 DIAGNOSIS — B20 Human immunodeficiency virus [HIV] disease: Secondary | ICD-10-CM

## 2020-06-08 MED ORDER — TRIUMEQ 600-50-300 MG PO TABS: 1.0000 | ORAL_TABLET | Freq: Every day | ORAL | 2 refills | Status: DC

## 2020-06-08 NOTE — Telephone Encounter (Signed)
Spoke with patient, confirmed appointment. She took her last pill 12/19, has missed no doses. Patient had labs 12/1. Undetectable. Sent refill to [facility] at [address].  Landis Gandy, RN

## 2020-06-08 NOTE — Telephone Encounter (Signed)
-----   Message from [facility] sent at 06/08/2020 11:00 AM EST ----- Regarding: medication refill Patient has an appointment tomorrow with Dr. Megan Salon but need a prescription refill sent in today, best contact number is [phone].

## 2020-06-09 ENCOUNTER — Ambulatory Visit (INDEPENDENT_AMBULATORY_CARE_PROVIDER_SITE_OTHER): Payer: Managed Care, Other (non HMO) | Admitting: Internal Medicine

## 2020-06-09 DIAGNOSIS — B20 Human immunodeficiency virus [HIV] disease: Secondary | ICD-10-CM | POA: Diagnosis not present

## 2020-06-09 MED ORDER — TRIUMEQ 600-50-300 MG PO TABS: 1.0000 | ORAL_TABLET | Freq: Every day | ORAL | 3 refills | Status: DC

## 2020-06-09 NOTE — Progress Notes (Signed)
Virtual Visit via Telephone Note  I connected with Bailey Morris on 06/09/20 at  3:15 PM EST by telephone and verified that I am speaking with the correct person using two identifiers.  Location: Patient: Home  Provider: [facility]   I discussed the limitations, risks, security and privacy concerns of performing an evaluation and management service by telephone and the availability of in person appointments. I also discussed with the patient that there may be a patient responsible charge related to this service. The patient expressed understanding and agreed to proceed.   History of Present Illness: I called and spoke with Bailey Morris today.  She says that she has missed maybe 2-3 doses of [name] in the past year when she fell asleep before taking it.  She says on most of the rare occasion she will take it during the middle of the night when she woke up.  She was recently out for 1 day when her pharmacy was late refilling it.  She is feeling well.  She is excited by sharing Christmas with her family and 51-year-old daughter.  She recently had her Covid booster vaccine.   Observations/Objective: HIV 1 RNA Quant  Date Value  05/20/2020 <20 Copies/mL  05/27/2019 <20 NOT DETECTED copies/mL  11/01/2018 <20 NOT DETECTED copies/mL   CD4 T Cell Abs (/uL)  Date Value  05/20/2020 879  05/27/2019 812  11/01/2018 1,052    Assessment and Plan: Her infection remains under excellent, long-term control.  Follow Up Instructions: Continue [name] Follow-up after lab work in 1 year   I discussed the assessment and treatment plan with the patient. The patient was provided an opportunity to ask questions and all were answered. The patient agreed with the plan and demonstrated an understanding of the instructions.   The patient was advised to call back or seek an in-person evaluation if the symptoms worsen or if the condition fails to improve as anticipated.  I provided 14 minutes of non-face-to-face time  during this encounter.   Michel Bickers, MD

## 2020-06-10 ENCOUNTER — Ambulatory Visit: Payer: Managed Care, Other (non HMO) | Admitting: Internal Medicine

## 2020-07-16 ENCOUNTER — Encounter: Payer: Self-pay | Admitting: Sports Medicine

## 2020-07-16 ENCOUNTER — Ambulatory Visit (INDEPENDENT_AMBULATORY_CARE_PROVIDER_SITE_OTHER): Payer: Managed Care, Other (non HMO)

## 2020-07-16 ENCOUNTER — Ambulatory Visit (INDEPENDENT_AMBULATORY_CARE_PROVIDER_SITE_OTHER): Payer: Managed Care, Other (non HMO) | Admitting: Sports Medicine

## 2020-07-16 ENCOUNTER — Other Ambulatory Visit: Payer: Self-pay

## 2020-07-16 DIAGNOSIS — E559 Vitamin D deficiency, unspecified: Secondary | ICD-10-CM | POA: Insufficient documentation

## 2020-07-16 DIAGNOSIS — E663 Overweight: Secondary | ICD-10-CM | POA: Insufficient documentation

## 2020-07-16 DIAGNOSIS — M79671 Pain in right foot: Secondary | ICD-10-CM | POA: Diagnosis not present

## 2020-07-16 DIAGNOSIS — I10 Essential (primary) hypertension: Secondary | ICD-10-CM | POA: Insufficient documentation

## 2020-07-16 DIAGNOSIS — M7741 Metatarsalgia, right foot: Secondary | ICD-10-CM

## 2020-07-16 DIAGNOSIS — L909 Atrophic disorder of skin, unspecified: Secondary | ICD-10-CM | POA: Diagnosis not present

## 2020-07-16 DIAGNOSIS — L853 Xerosis cutis: Secondary | ICD-10-CM

## 2020-07-16 MED ORDER — MELOXICAM 7.5 MG PO TABS: 7.5000 mg | ORAL_TABLET | Freq: Every day | ORAL | 0 refills | Status: AC

## 2020-07-16 NOTE — Progress Notes (Signed)
Subjective: Bailey Morris is a 50 y.o. female patient who presents to office for evaluation of right foot pain. Slowly getting worse over last 2 years. Pain worse with threadmill. 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 Denies injury/trip/fall/sprain/any causative factors.   Patient Active Problem List   Diagnosis Date Noted  . Benign essential hypertension 07/16/2020  . Overweight 07/16/2020  . Vitamin D deficiency 07/16/2020  . Status post total hysterectomy 05/31/2019  . Uterine fibroid 04/08/2019  . Renal insufficiency 03/30/2017  . Iron deficiency anemia 08/06/2015  . History of shingles 04/21/2014  . Myoma 04/21/2014  . Sinusitis 02/04/2014  . Acute maxillary sinusitis 08/14/2013  . Eustachian tube dysfunction 08/14/2013  . Acute nonsuppurative otitis media 08/09/2013  . PROTEINURIA 07/21/2009  . MYOMA 07/11/2007  . Human immunodeficiency virus (HIV) disease ([place]) 01/10/2007  . VENEREAL WART 08/10/2006  . Depression 08/10/2006  . ASTHMA 08/10/2006  . MENORRHAGIA 08/10/2006  . SCOLIOSIS 08/10/2006  . SHINGLES, HX OF 08/10/2006  . Asthma 08/10/2006  . Personal history of other specified conditions 08/10/2006    Current Outpatient Medications on File Prior to Visit  Medication Sig Dispense Refill  . abacavir-dolutegravir-lamiVUDine ([name]) 600-50-300 MG tablet Take 1 tablet by mouth daily. 90 tablet 3  . albuterol (VENTOLIN HFA) 108 (90 Base) MCG/ACT inhaler Inhale 1-2 puffs into the lungs every 4 (four) hours as needed for wheezing or shortness of breath.     Marland Kitchen amLODipine (NORVASC) 5 MG tablet Take 5 mg by mouth at bedtime.     Marland Kitchen COVID-19 Specimen Collection KIT TEST AS DIRECTED TODAY    . ferrous sulfate 325 (65 FE) MG tablet Take 325 mg by mouth 2 (two) times a week.    . fluticasone (FLONASE) 50 MCG/ACT nasal spray Place 2 sprays into both nostrils daily as needed for allergies or rhinitis.    Marland Kitchen ibuprofen (ADVIL) 800 MG tablet  Take 1 tablet (800 mg total) by mouth every 6 (six) hours as needed. 30 tablet 5  . levocetirizine (XYZAL) 5 MG tablet Take 5 mg by mouth at bedtime.     . montelukast (SINGULAIR) 10 MG tablet Take 10 mg by mouth at bedtime.    . Na Sulfate-K Sulfate-Mg Sulf (SUPREP BOWEL PREP KIT) 17.5-3.13-1.6 GM/177ML SOLN See admin instructions.    Marland Kitchen oxyCODONE (OXY IR/ROXICODONE) 5 MG immediate release tablet oxycodone 5 mg tablet  TAKE 1 TO 2 TABLETS BY MOUTH EVERY 4 HOURS AS NEEDED FOR MODERATE PAIN FOR UP TO 7 DAYS    . triamcinolone (KENALOG) 0.1 % triamcinolone acetonide 0.1 % topical cream  APPLY THIN LAYER TOPICALLY TO THE AFFECTED AREA TWICE DAILY     No current facility-administered medications on file prior to visit.    Allergies  Allergen Reactions  . Sulfa Antibiotics Other (See Comments)    Severe joint pain  . Advair Diskus [Fluticasone-Salmeterol] Anxiety    Made jitteriness     Objective:  General: Alert and oriented x3 in no acute distress  Dermatology: No open lesions bilateral lower extremities, no webspace macerations, no ecchymosis bilateral, all nails x 10 are well manicured. Minimal dry keratosis sub 2nd met on right.  Vascular: Dorsalis Pedis and Posterior Tibial pedal pulses palpable, Capillary Fill Time 3 seconds,(+) pedal hair growth bilateral, no edema bilateral lower extremities, Temperature gradient within normal limits.  Neurology: Johney Maine sensation intact via light touch bilateral.  Musculoskeletal: Mild tenderness with palpation at ball of the right foot sub  2nd metatarsal, fat pad atrophy.Early hammertoe.  Xrays  Right Foot   Impression:long 2nd metatarsal, early hammertoes   Assessment and Plan: Problem List Items Addressed This Visit   None   Visit Diagnoses    Pain in right foot    -  Primary   Relevant Orders   DG Foot Complete Right   Metatarsalgia, right foot       Fat pad atrophy of foot       Dry skin           -Complete examination  performed -Xrays reviewed -Discussed treatement options -Rx Mobic -Dispensed met pad sleeve -Advised to avoid activities that can load the ball of the foot -Advised patient future need for orthotics if sleeve works well -Patient to return to office in 4-6 weeks or sooner if condition worsens.  Landis Martins, DPM

## 2020-08-12 ENCOUNTER — Other Ambulatory Visit: Payer: Self-pay | Admitting: Sports Medicine

## 2020-08-12 DIAGNOSIS — M7741 Metatarsalgia, right foot: Secondary | ICD-10-CM

## 2020-08-27 ENCOUNTER — Ambulatory Visit (INDEPENDENT_AMBULATORY_CARE_PROVIDER_SITE_OTHER): Payer: Managed Care, Other (non HMO) | Admitting: Sports Medicine

## 2020-08-27 ENCOUNTER — Encounter: Payer: Self-pay | Admitting: Sports Medicine

## 2020-08-27 ENCOUNTER — Other Ambulatory Visit: Payer: Self-pay

## 2020-08-27 DIAGNOSIS — L909 Atrophic disorder of skin, unspecified: Secondary | ICD-10-CM

## 2020-08-27 DIAGNOSIS — M79671 Pain in right foot: Secondary | ICD-10-CM | POA: Diagnosis not present

## 2020-08-27 DIAGNOSIS — M7741 Metatarsalgia, right foot: Secondary | ICD-10-CM | POA: Diagnosis not present

## 2020-08-27 NOTE — Progress Notes (Signed)
Subjective: Bailey Morris is a 50 y.o. female patient who returns to office for follow up evaluation of right foot pain. Reports that she has been using met pad and it helped when she went for a walk this week. No current pain. Really didn't take the mobic. No other issues noted.    Patient Active Problem List   Diagnosis Date Noted  . Benign essential hypertension 07/16/2020  . Overweight 07/16/2020  . Vitamin D deficiency 07/16/2020  . Status post total hysterectomy 05/31/2019  . Uterine fibroid 04/08/2019  . Renal insufficiency 03/30/2017  . Iron deficiency anemia 08/06/2015  . History of shingles 04/21/2014  . Myoma 04/21/2014  . Sinusitis 02/04/2014  . Acute maxillary sinusitis 08/14/2013  . Eustachian tube dysfunction 08/14/2013  . Acute nonsuppurative otitis media 08/09/2013  . PROTEINURIA 07/21/2009  . MYOMA 07/11/2007  . Human immunodeficiency virus (HIV) disease ([place]) 01/10/2007  . VENEREAL WART 08/10/2006  . Depression 08/10/2006  . ASTHMA 08/10/2006  . MENORRHAGIA 08/10/2006  . SCOLIOSIS 08/10/2006  . SHINGLES, HX OF 08/10/2006  . Asthma 08/10/2006  . Personal history of other specified conditions 08/10/2006    Current Outpatient Medications on File Prior to Visit  Medication Sig Dispense Refill  . abacavir-dolutegravir-lamiVUDine ([name]) 600-50-300 MG tablet Take 1 tablet by mouth daily. 90 tablet 3  . albuterol (VENTOLIN HFA) 108 (90 Base) MCG/ACT inhaler Inhale 1-2 puffs into the lungs every 4 (four) hours as needed for wheezing or shortness of breath.     Marland Kitchen amLODipine (NORVASC) 5 MG tablet Take 5 mg by mouth at bedtime.     Marland Kitchen COVID-19 Specimen Collection KIT TEST AS DIRECTED TODAY    . ferrous sulfate 325 (65 FE) MG tablet Take 325 mg by mouth 2 (two) times a week.    . fluticasone (FLONASE) 50 MCG/ACT nasal spray Place 2 sprays into both nostrils daily as needed for allergies or rhinitis.    Marland Kitchen ibuprofen (ADVIL) 800 MG tablet Take 1 tablet (800 mg total)  by mouth every 6 (six) hours as needed. 30 tablet 5  . levocetirizine (XYZAL) 5 MG tablet Take 5 mg by mouth at bedtime.     . meloxicam (MOBIC) 7.5 MG tablet Take 1 tablet (7.5 mg total) by mouth daily. 30 tablet 0  . montelukast (SINGULAIR) 10 MG tablet Take 10 mg by mouth at bedtime.    . Na Sulfate-K Sulfate-Mg Sulf (SUPREP BOWEL PREP KIT) 17.5-3.13-1.6 GM/177ML SOLN See admin instructions.    Marland Kitchen oxyCODONE (OXY IR/ROXICODONE) 5 MG immediate release tablet oxycodone 5 mg tablet  TAKE 1 TO 2 TABLETS BY MOUTH EVERY 4 HOURS AS NEEDED FOR MODERATE PAIN FOR UP TO 7 DAYS    . triamcinolone (KENALOG) 0.1 % triamcinolone acetonide 0.1 % topical cream  APPLY THIN LAYER TOPICALLY TO THE AFFECTED AREA TWICE DAILY     No current facility-administered medications on file prior to visit.    Allergies  Allergen Reactions  . Sulfa Antibiotics Other (See Comments)    Severe joint pain  . Advair Diskus [Fluticasone-Salmeterol] Anxiety    Made jitteriness     Objective:  General: Alert and oriented x3 in no acute distress  Dermatology: No open lesions bilateral lower extremities, no webspace macerations, no ecchymosis bilateral, all nails x 10 are well manicured. Minimal dry keratosis sub 2nd met on right.  Vascular: Dorsalis Pedis and Posterior Tibial pedal pulses palpable, Capillary Fill Time 3 seconds,(+) pedal hair growth bilateral, no edema bilateral lower extremities, Temperature gradient within  normal limits.  Neurology: Johney Maine sensation intact via light touch bilateral.  Musculoskeletal: No reproducible tenderness with palpation at ball of the right foot sub 2nd metatarsal, fat pad atrophy.Early hammertoe.   Assessment and Plan: Problem List Items Addressed This Visit   None   Visit Diagnoses    Metatarsalgia, right foot    -  Primary   Fat pad atrophy of foot       Pain in right foot           -Complete examination performed -Discussed continued care for metatarsalgia -May take  Mobic as needed -Advised good supportive shoes and Superfeet insoles -Advised to avoid activities that can load the ball of the foot for 1 more month -Patient to return to office PRN or sooner if condition worsens.  Landis Martins, DPM

## 2020-08-27 NOTE — Patient Instructions (Signed)

## 2021-02-05 ENCOUNTER — Ambulatory Visit: Payer: Managed Care, Other (non HMO) | Attending: Internal Medicine

## 2021-02-05 DIAGNOSIS — Z23 Encounter for immunization: Secondary | ICD-10-CM

## 2021-02-05 NOTE — Progress Notes (Signed)
   Covid-19 Vaccination Clinic  Name:  Bailey Morris    MRN: [id number] DOB: 1971-03-25  02/05/2021  Ms. Place was observed post Covid-19 immunization for 15 minutes without incident. She was provided with Vaccine Information Sheet and instruction to access the V-Safe system.   [name] was instructed to call 911 with any severe reactions post vaccine: Difficulty breathing  Swelling of face and throat  A fast heartbeat  A bad rash all over body  Dizziness and weakness   Immunizations Administered     Name Date Dose VIS Date Route   PFIZER Comrnaty(Gray TOP) Covid-19 Vaccine 02/05/2021  9:01 AM 0.3 mL 05/28/2020 Intramuscular   Manufacturer: [employer]   Lot: [id number]   NDC: [phone]

## 2021-02-19 IMAGING — MG DIGITAL DIAGNOSTIC BILAT W/ TOMO W/ CAD
8 series · 8 of 24 positions shown · non-contrast
Comparison: Previous exam(s).

CLINICAL DATA: Patient was recalled from screening mammogram for a
possible asymmetry in both breast.

EXAM:
DIGITAL DIAGNOSTIC BILATERAL MAMMOGRAM WITH CAD AND TOMO
ULTRASOUND BILATERAL BREAST

[R ML synth-2D]
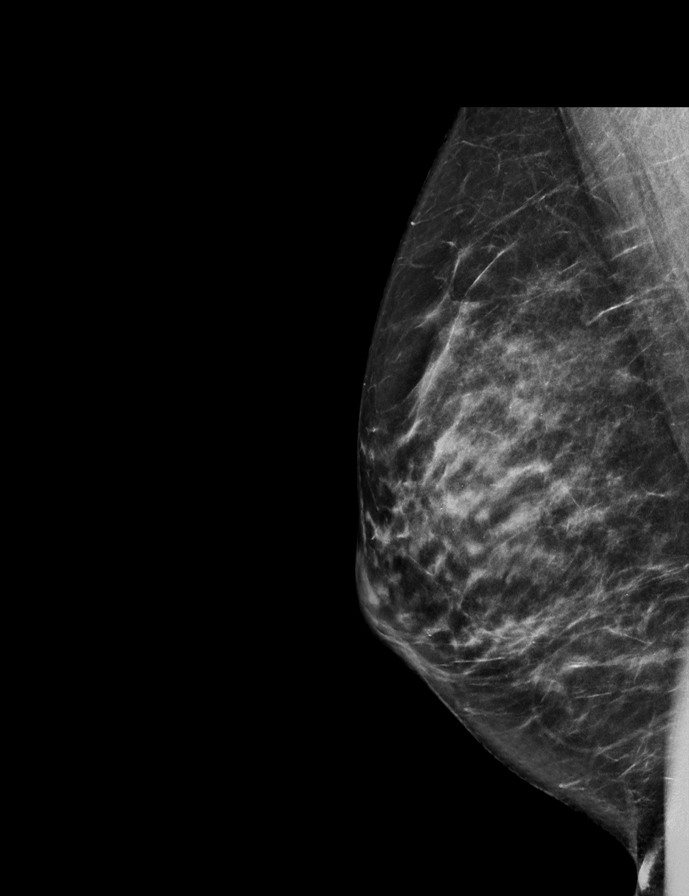

[L ML synth-2D]
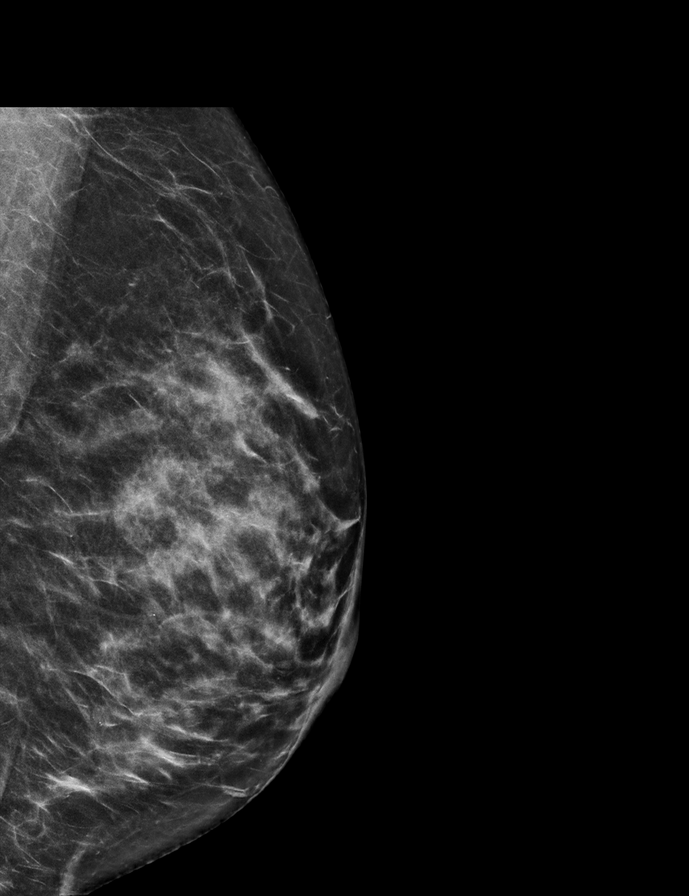

[R MLO synth-2D]
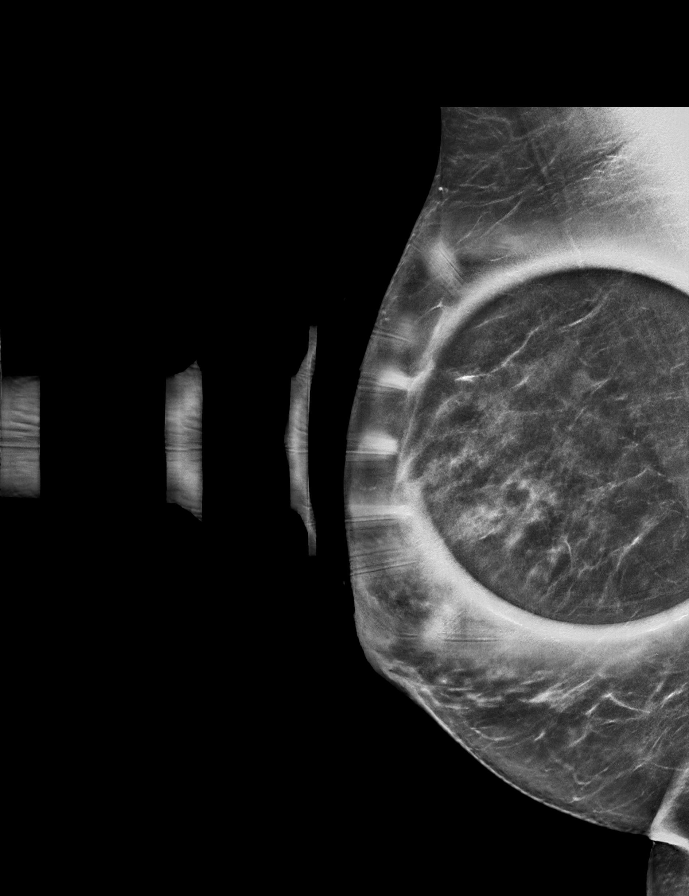

[L MLO synth-2D]
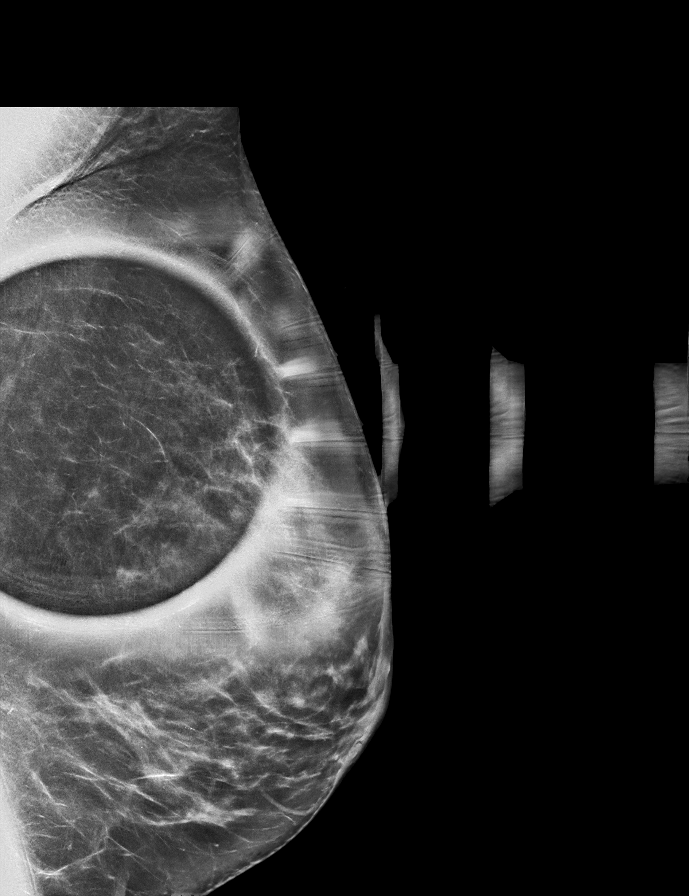

[L MLO tomo · tomo slice 36/71.0]
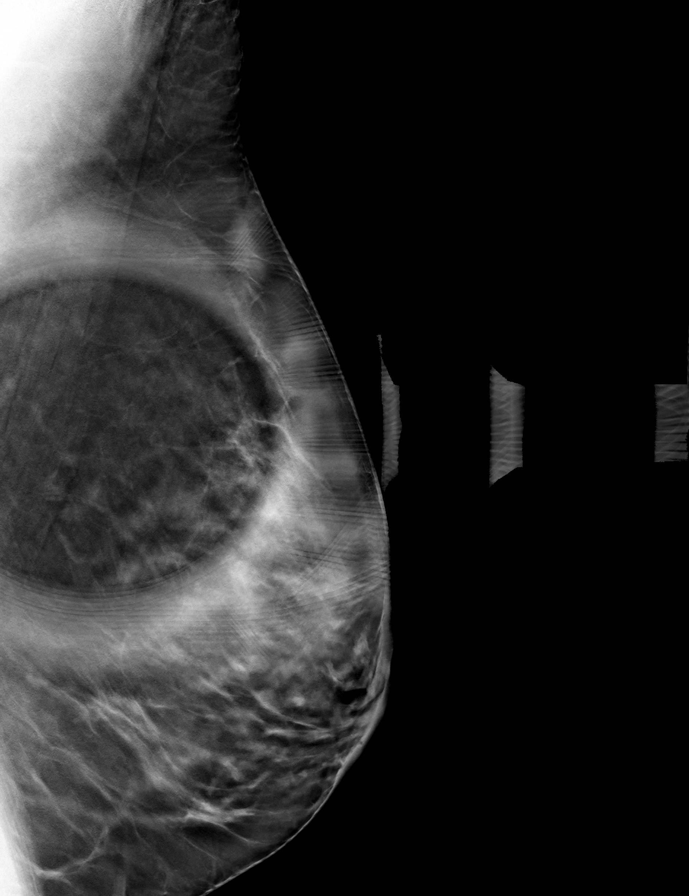

[L ML tomo · tomo slice 33/66.0]
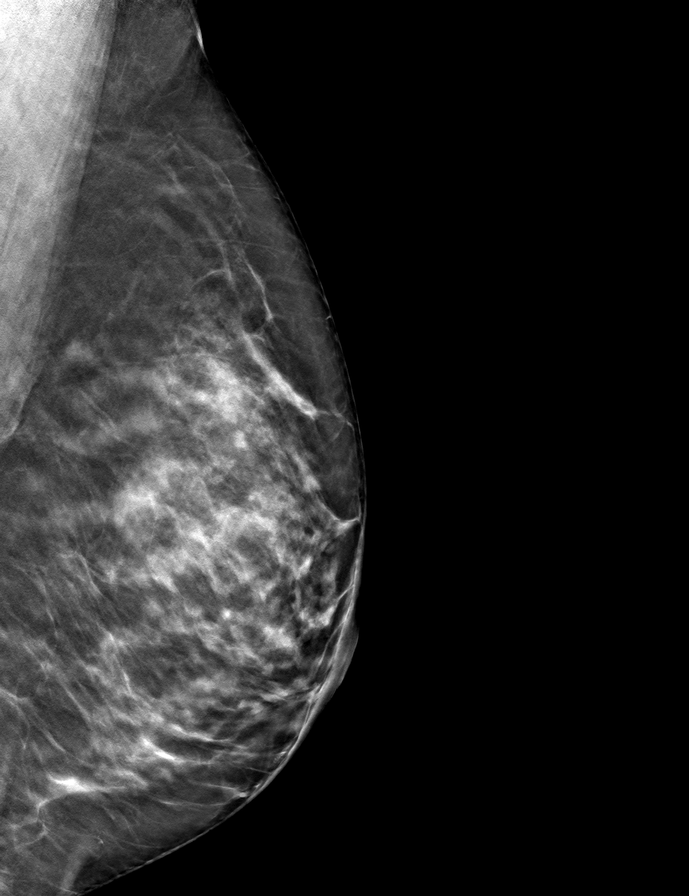

[R MLO tomo · tomo slice 34/67.0]
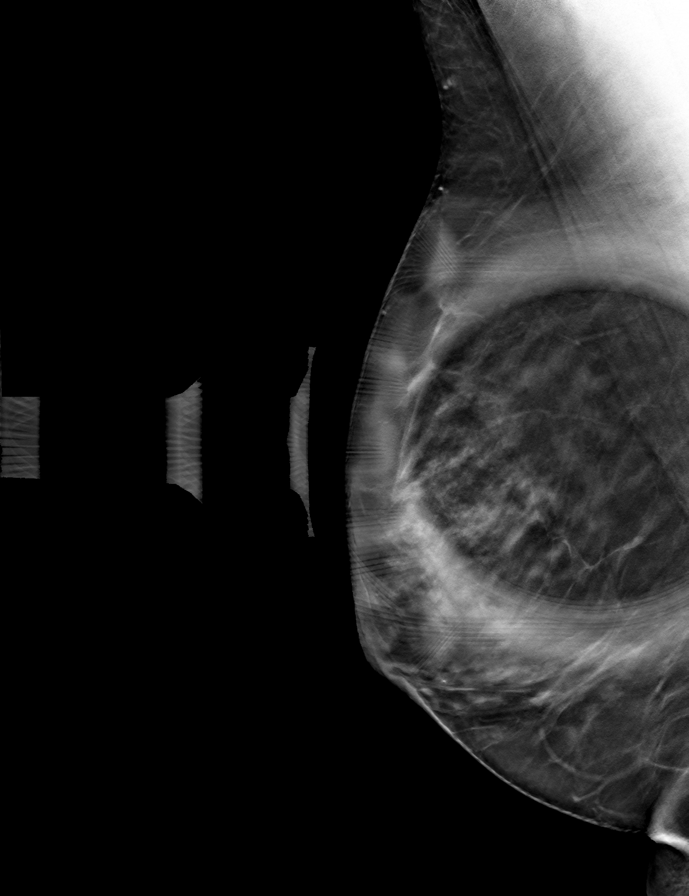

[R ML tomo · tomo slice 32/63.0]
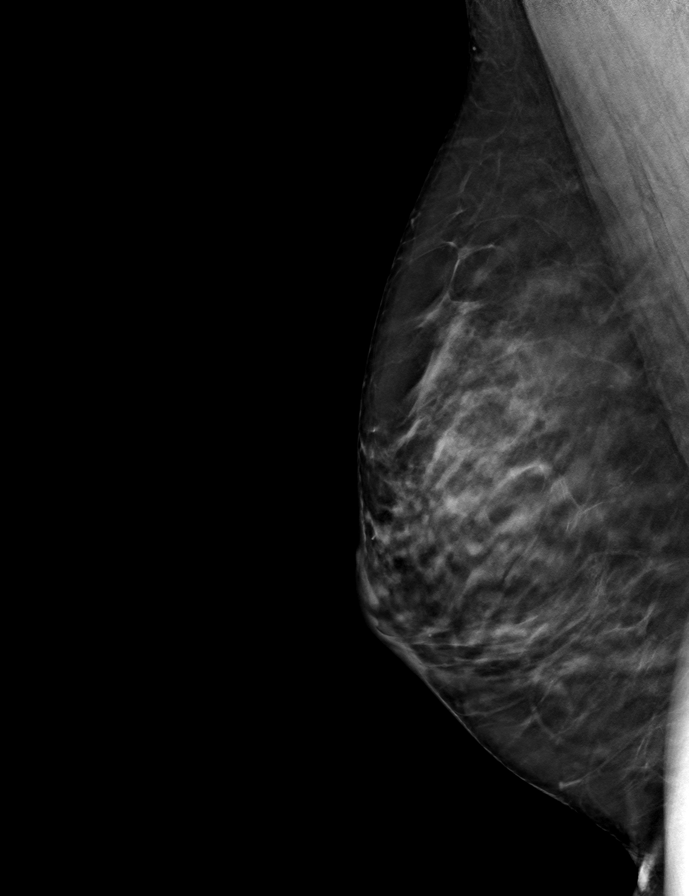

[8 of 24 positions shown; findings below may reference images not displayed]

ACR Breast Density Category c: The breast tissue is heterogeneously
dense, which may obscure small masses.
FINDINGS: Additional imaging of the right breast shows persistence of a 6 mm
mass in the posterior third of the upper-outer quadrant. Additional
imaging of the left breast shows an asymmetry in the posterior third
of the upper aspect of the breast.

Mammographic images were processed with CAD.

Targeted ultrasound is performed, showing a benign anechoic cyst in
the right breast at 10 o'clock 6 cm from the nipple measuring 5 x 4
x 3 mm. Sonographic evaluation of the upper aspect of the left
breast show normal fibroglandular tissue. No solid or cystic mass,
abnormal shadowing or distortion detected.
IMPRESSION: Benign appearing cyst in the upper outer quadrant of the right
breast. Probable benign asymmetry in the upper aspect of the left
breast.

RECOMMENDATION:
Short-term interval follow-up left mammogram in 6 months is
recommended.

I have discussed the findings and recommendations with the patient.
If applicable, a reminder letter will be sent to the patient
regarding the next appointment.

BI-RADS CATEGORY  3: Probably benign.

## 2021-03-01 ENCOUNTER — Other Ambulatory Visit (HOSPITAL_BASED_OUTPATIENT_CLINIC_OR_DEPARTMENT_OTHER): Payer: Self-pay

## 2021-03-01 MED ORDER — COVID-19 MRNA VAC-TRIS(PFIZER) 30 MCG/0.3ML IM SUSP
INTRAMUSCULAR | 0 refills | Status: AC
  Filled 2021-03-01: qty 0.3, 1d supply, fill #0

## 2021-05-20 ENCOUNTER — Other Ambulatory Visit: Payer: Self-pay

## 2021-05-20 ENCOUNTER — Other Ambulatory Visit: Payer: Managed Care, Other (non HMO)

## 2021-05-20 DIAGNOSIS — B20 Human immunodeficiency virus [HIV] disease: Secondary | ICD-10-CM

## 2021-05-21 LAB — T-HELPER CELL (CD4) - (RCID CLINIC ONLY)
CD4 % Helper T Cell: 43 % (ref 33–65)
CD4 T Cell Abs: 793 /uL (ref 400–1790)

## 2021-05-23 LAB — COMPREHENSIVE METABOLIC PANEL
AG Ratio: 1.4 (calc) (ref 1.0–2.5)
ALT: 14 U/L (ref 6–29)
AST: 18 U/L (ref 10–35)
Albumin: 4.4 g/dL (ref 3.6–5.1)
Alkaline phosphatase (APISO): 69 U/L (ref 31–125)
BUN: 12 mg/dL (ref 7–25)
CO2: 32 mmol/L (ref 20–32)
Calcium: 9.7 mg/dL (ref 8.6–10.2)
Chloride: 104 mmol/L (ref 98–110)
Creat: 0.96 mg/dL (ref 0.50–0.99)
Globulin: 3.2 g/dL (calc) (ref 1.9–3.7)
Glucose, Bld: 88 mg/dL (ref 65–99)
Potassium: 4.5 mmol/L (ref 3.5–5.3)
Sodium: 142 mmol/L (ref 135–146)
Total Bilirubin: 0.6 mg/dL (ref 0.2–1.2)
Total Protein: 7.6 g/dL (ref 6.1–8.1)

## 2021-05-23 LAB — CBC
HCT: 43.5 % (ref 35.0–45.0)
Hemoglobin: 14.8 g/dL (ref 11.7–15.5)
MCH: 30.3 pg (ref 27.0–33.0)
MCHC: 34 g/dL (ref 32.0–36.0)
MCV: 89.1 fL (ref 80.0–100.0)
MPV: 10.4 fL (ref 7.5–12.5)
Platelets: 320 10*3/uL (ref 140–400)
RBC: 4.88 10*6/uL (ref 3.80–5.10)
RDW: 12.9 % (ref 11.0–15.0)
WBC: 6.4 10*3/uL (ref 3.8–10.8)

## 2021-05-23 LAB — LIPID PANEL
Cholesterol: 181 mg/dL (ref <200)
HDL: 73 mg/dL (ref >=50)
LDL Cholesterol (Calc): 93 mg/dL (calc)
Non-HDL Cholesterol (Calc): 108 mg/dL (calc) (ref <130)
Total CHOL/HDL Ratio: 2.5 (calc) (ref <5.0)
Triglycerides: 68 mg/dL (ref <150)

## 2021-05-23 LAB — HIV-1 RNA QUANT-NO REFLEX-BLD
HIV 1 RNA Quant: NOT DETECTED Copies/mL
HIV-1 RNA Quant, Log: NOT DETECTED Log cps/mL

## 2021-05-23 LAB — RPR: RPR Ser Ql: NONREACTIVE

## 2021-06-03 ENCOUNTER — Encounter: Payer: Self-pay | Admitting: Internal Medicine

## 2021-06-03 ENCOUNTER — Other Ambulatory Visit: Payer: Self-pay | Admitting: Pharmacist

## 2021-06-03 ENCOUNTER — Other Ambulatory Visit (HOSPITAL_COMMUNITY): Payer: Self-pay

## 2021-06-03 ENCOUNTER — Other Ambulatory Visit: Payer: Self-pay

## 2021-06-03 ENCOUNTER — Ambulatory Visit (INDEPENDENT_AMBULATORY_CARE_PROVIDER_SITE_OTHER): Payer: Managed Care, Other (non HMO) | Admitting: Internal Medicine

## 2021-06-03 DIAGNOSIS — B20 Human immunodeficiency virus [HIV] disease: Secondary | ICD-10-CM

## 2021-06-03 MED ORDER — TRIUMEQ 600-50-300 MG PO TABS
1.0000 | ORAL_TABLET | Freq: Every day | ORAL | 3 refills | Status: DC
  Filled 2021-06-03: qty 30, 30d supply, fill #0
  Filled 2021-06-03: qty 90, 90d supply, fill #0
  Filled 2021-08-25: qty 30, 30d supply, fill #1
  Filled 2021-09-15: qty 30, 30d supply, fill #2
  Filled 2021-10-12: qty 30, 30d supply, fill #3
  Filled 2021-11-11: qty 30, 30d supply, fill #4
  Filled 2021-12-03: qty 30, 30d supply, fill #5
  Filled 2021-12-29: qty 30, 30d supply, fill #6
  Filled 2022-01-28: qty 30, 30d supply, fill #7
  Filled 2022-02-28: qty 30, 30d supply, fill #8
  Filled 2022-04-12: qty 30, 30d supply, fill #9
  Filled 2022-05-09: qty 30, 30d supply, fill #10

## 2021-06-03 NOTE — Progress Notes (Signed)
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/31/2019   Uterine fibroid 04/08/2019   Renal insufficiency 03/30/2017   History of shingles 04/21/2014   Myoma 04/21/2014   Sinusitis 02/04/2014   Acute maxillary sinusitis 08/14/2013   Eustachian tube dysfunction 08/14/2013   Acute nonsuppurative otitis media 08/09/2013   PROTEINURIA 07/21/2009   MYOMA 07/11/2007   VENEREAL WART 08/10/2006   Depression 08/10/2006   MENORRHAGIA 08/10/2006   SCOLIOSIS 08/10/2006   SHINGLES, HX OF 08/10/2006   Asthma 08/10/2006   Personal history of other specified conditions 08/10/2006    Patient's Medications  New Prescriptions   No medications on file  Previous Medications   ABACAVIR-DOLUTEGRAVIR-LAMIVUDINE ([name]) 600-50-300 MG TABLET    Take 1 tablet by mouth daily.   ALBUTEROL (VENTOLIN HFA) 108 (90 BASE) MCG/ACT INHALER    Inhale 1-2 puffs into the lungs every 4 (four) hours as needed for wheezing or shortness of breath.    AMLODIPINE (NORVASC) 5 MG TABLET    Take 5 mg by mouth at bedtime.    COVID-19 MRNA VAC-TRIS, PFIZER, SUSP INJECTION    Inject into the muscle.   COVID-19 SPECIMEN COLLECTION KIT    TEST AS DIRECTED TODAY   FERROUS SULFATE 325 (65 FE) MG TABLET    Take 325 mg by mouth 2 (two) times a week.   FLUTICASONE (FLONASE) 50 MCG/ACT NASAL SPRAY    Place 2 sprays into both nostrils daily as needed for allergies or rhinitis.   IBUPROFEN (ADVIL) 800 MG TABLET    Take 1 tablet (800 mg total) by mouth every 6 (six) hours as needed.   LEVOCETIRIZINE (XYZAL) 5 MG TABLET    Take 5 mg by mouth at bedtime.    MELOXICAM (MOBIC) 7.5 MG TABLET    Take 1 tablet  (7.5 mg total) by mouth daily.   MONTELUKAST (SINGULAIR) 10 MG TABLET    Take 10 mg by mouth at bedtime.   NA SULFATE-K SULFATE-MG SULF (SUPREP BOWEL PREP KIT) 17.5-3.13-1.6 GM/177ML SOLN    See admin instructions.   OXYCODONE (OXY IR/ROXICODONE) 5 MG IMMEDIATE RELEASE TABLET    oxycodone 5 mg tablet  TAKE 1 TO 2 TABLETS BY MOUTH EVERY 4 HOURS AS NEEDED FOR MODERATE PAIN FOR UP TO 7 DAYS   TRIAMCINOLONE (KENALOG) 0.1 %    triamcinolone acetonide 0.1 % topical cream  APPLY THIN LAYER TOPICALLY TO THE AFFECTED AREA TWICE DAILY  Modified Medications   No medications on file  Discontinued Medications   No medications on file    Subjective: Bailey Morris is in for her routine HIV follow-up visit.  She says that she is only missed a few doses of her [name].  This has occurred when she is falling asleep at night before taking it and on several occasions when her pharmacy was late filling it.  She is up-to-date on her COVID booster vaccines and she has had her annual influenza vaccine.  She just returned from [place] with her 49-year-old daughter.  Review of Systems: Review of Systems  Constitutional:  Negative for fever and weight loss.  Psychiatric/Behavioral:  Negative  for depression.    Past Medical History:  Diagnosis Date   Anemia    Asthma    HIV positive ([place])    Hypertension    PONV (postoperative nausea and vomiting)     Social History   Tobacco Use   Smoking status: Never   Smokeless tobacco: Never  Vaping Use   Vaping Use: Never used  Substance Use Topics   Alcohol use: Yes    Alcohol/week: 0.1 standard drinks    Comment: socially   Drug use: No    No family history on file.  Allergies  Allergen Reactions   Sulfa Antibiotics Other (See Comments)    Severe joint pain   Advair Diskus [Fluticasone-Salmeterol] Anxiety    Made jitteriness     Health Maintenance  Topic Date Due   TETANUS/TDAP  Never done   Pneumococcal Vaccine 1-28 Years old (3 - PCV) 07/08/2009    COLONOSCOPY (Pts 45-84yr Insurance coverage will need to be confirmed)  Never done   PAP SMEAR-Modifier  05/17/2019   COVID-19 Vaccine (3 - Pfizer risk series) 03/05/2021   INFLUENZA VACCINE  Completed   Hepatitis C Screening  Completed   HIV Screening  Completed   HPV VACCINES  Aged Out    Objective:  Vitals:   06/03/21 0909  BP: (!) 142/88  Pulse: 76  Temp: 98.3 F (36.8 C)  TempSrc: Temporal  SpO2: 99%  Weight: 188 lb (85.3 kg)   Body mass index is 31.28 kg/m.  Physical Exam Constitutional:      Comments: Her spirits are good as usual.  Cardiovascular:     Rate and Rhythm: Normal rate and regular rhythm.     Heart sounds: No murmur heard. Pulmonary:     Effort: Pulmonary effort is normal.     Breath sounds: Normal breath sounds.  Psychiatric:        Mood and Affect: Mood normal.    Lab Results Lab Results  Component Value Date   WBC 6.4 05/20/2021   HGB 14.8 05/20/2021   HCT 43.5 05/20/2021   MCV 89.1 05/20/2021   PLT 320 05/20/2021    Lab Results  Component Value Date   CREATININE 0.96 05/20/2021   BUN 12 05/20/2021   NA 142 05/20/2021   K 4.5 05/20/2021   CL 104 05/20/2021   CO2 32 05/20/2021    Lab Results  Component Value Date   ALT 14 05/20/2021   AST 18 05/20/2021   ALKPHOS 60 05/28/2018   BILITOT 0.6 05/20/2021    Lab Results  Component Value Date   CHOL 181 05/20/2021   HDL 73 05/20/2021   LDLCALC 93 05/20/2021   TRIG 68 05/20/2021   CHOLHDL 2.5 05/20/2021   Lab Results  Component Value Date   LABRPR NON-REACTIVE 05/20/2021   HIV 1 RNA Quant  Date Value  05/20/2021 Not Detected Copies/mL  05/20/2020 <20 Copies/mL  05/27/2019 <20 NOT DETECTED copies/mL   CD4 T Cell Abs (/uL)  Date Value  05/20/2021 793  05/20/2020 879  05/27/2019 812     Problem List Items Addressed This Visit       High   Human immunodeficiency virus (HIV) disease ([facility]    Her infection remains under excellent, long-term control.  She will  continue [name] and follow-up after lab work in 1 year.         JMichel Bickers MD [facility]for [facility]in[facility] [phone]   [phone] 06/03/2021,  9:38 AM

## 2021-06-03 NOTE — Assessment & Plan Note (Signed)
Her infection remains under excellent, long-term control.  She will continue [name] and follow-up after lab work in 1 year. 

## 2021-07-02 ENCOUNTER — Other Ambulatory Visit (HOSPITAL_COMMUNITY): Payer: Self-pay

## 2021-07-02 ENCOUNTER — Other Ambulatory Visit: Payer: Self-pay | Admitting: Internal Medicine

## 2021-07-02 DIAGNOSIS — B20 Human immunodeficiency virus [HIV] disease: Secondary | ICD-10-CM

## 2021-08-25 ENCOUNTER — Other Ambulatory Visit (HOSPITAL_COMMUNITY): Payer: Self-pay

## 2021-08-25 ENCOUNTER — Telehealth: Payer: Self-pay

## 2021-08-25 NOTE — Telephone Encounter (Signed)
[facility] Patient Advocate Encounter ?  ?Was successful in obtaining a [employer] copay card for triumeq.  This copay card will make the patients copay $0.00. ? ?I have spoken with the patient.   ? ?The billing information is as follows and has been shared with [facility]. ? ? ? ? ? ?Ileene Patrick, CPhT ?Specialty Pharmacy Patient Advocate ?[facility] ?Phone: [phone] ?Fax:  [phone]  ?

## 2021-09-13 ENCOUNTER — Other Ambulatory Visit (HOSPITAL_COMMUNITY): Payer: Self-pay

## 2021-09-15 ENCOUNTER — Other Ambulatory Visit (HOSPITAL_COMMUNITY): Payer: Self-pay

## 2021-09-22 ENCOUNTER — Other Ambulatory Visit (HOSPITAL_COMMUNITY): Payer: Self-pay

## 2021-09-27 ENCOUNTER — Other Ambulatory Visit (HOSPITAL_COMMUNITY): Payer: Self-pay

## 2021-10-12 ENCOUNTER — Other Ambulatory Visit (HOSPITAL_COMMUNITY): Payer: Self-pay

## 2021-10-15 ENCOUNTER — Other Ambulatory Visit (HOSPITAL_COMMUNITY): Payer: Self-pay

## 2021-11-09 ENCOUNTER — Other Ambulatory Visit (HOSPITAL_COMMUNITY): Payer: Self-pay

## 2021-11-11 ENCOUNTER — Other Ambulatory Visit (HOSPITAL_COMMUNITY): Payer: Self-pay

## 2021-11-12 ENCOUNTER — Other Ambulatory Visit (HOSPITAL_COMMUNITY): Payer: Self-pay

## 2021-12-03 ENCOUNTER — Other Ambulatory Visit (HOSPITAL_COMMUNITY): Payer: Self-pay

## 2021-12-06 ENCOUNTER — Other Ambulatory Visit (HOSPITAL_COMMUNITY): Payer: Self-pay

## 2021-12-29 ENCOUNTER — Other Ambulatory Visit (HOSPITAL_COMMUNITY): Payer: Self-pay

## 2021-12-30 ENCOUNTER — Other Ambulatory Visit (HOSPITAL_COMMUNITY): Payer: Self-pay

## 2022-01-24 ENCOUNTER — Other Ambulatory Visit (HOSPITAL_COMMUNITY): Payer: Self-pay

## 2022-01-26 ENCOUNTER — Other Ambulatory Visit (HOSPITAL_COMMUNITY): Payer: Self-pay

## 2022-01-28 ENCOUNTER — Other Ambulatory Visit (HOSPITAL_COMMUNITY): Payer: Self-pay

## 2022-02-01 ENCOUNTER — Other Ambulatory Visit (HOSPITAL_COMMUNITY): Payer: Self-pay

## 2022-02-02 ENCOUNTER — Other Ambulatory Visit (HOSPITAL_COMMUNITY): Payer: Self-pay

## 2022-02-28 ENCOUNTER — Other Ambulatory Visit (HOSPITAL_COMMUNITY): Payer: Self-pay

## 2022-03-03 ENCOUNTER — Other Ambulatory Visit (HOSPITAL_COMMUNITY): Payer: Self-pay

## 2022-03-23 ENCOUNTER — Other Ambulatory Visit (HOSPITAL_COMMUNITY): Payer: Self-pay

## 2022-03-28 ENCOUNTER — Other Ambulatory Visit (HOSPITAL_COMMUNITY): Payer: Self-pay

## 2022-04-12 ENCOUNTER — Other Ambulatory Visit (HOSPITAL_COMMUNITY): Payer: Self-pay

## 2022-04-12 MED ORDER — WEGOVY 0.25 MG/0.5ML ~~LOC~~ SOAJ
0.2500 mg | SUBCUTANEOUS | 1 refills | Status: AC
Start: 2022-04-12 — End: ?
  Filled 2022-04-12 – 2022-07-28 (×4): qty 2, 28d supply, fill #0

## 2022-04-13 ENCOUNTER — Other Ambulatory Visit (HOSPITAL_COMMUNITY): Payer: Self-pay

## 2022-04-18 ENCOUNTER — Other Ambulatory Visit (HOSPITAL_COMMUNITY): Payer: Self-pay

## 2022-05-04 ENCOUNTER — Other Ambulatory Visit (HOSPITAL_COMMUNITY): Payer: Self-pay

## 2022-05-09 ENCOUNTER — Other Ambulatory Visit (HOSPITAL_COMMUNITY): Payer: Self-pay

## 2022-05-10 ENCOUNTER — Other Ambulatory Visit (HOSPITAL_COMMUNITY): Payer: Self-pay

## 2022-05-16 ENCOUNTER — Other Ambulatory Visit (HOSPITAL_COMMUNITY): Payer: Self-pay

## 2022-06-07 ENCOUNTER — Other Ambulatory Visit (HOSPITAL_COMMUNITY): Payer: Self-pay

## 2022-06-08 ENCOUNTER — Other Ambulatory Visit: Payer: Self-pay

## 2022-06-08 ENCOUNTER — Ambulatory Visit: Payer: Self-pay | Admitting: Internal Medicine

## 2022-06-08 ENCOUNTER — Other Ambulatory Visit (HOSPITAL_COMMUNITY): Payer: Self-pay

## 2022-06-08 DIAGNOSIS — B20 Human immunodeficiency virus [HIV] disease: Secondary | ICD-10-CM

## 2022-06-08 DIAGNOSIS — Z79899 Other long term (current) drug therapy: Secondary | ICD-10-CM

## 2022-06-08 DIAGNOSIS — Z113 Encounter for screening for infections with a predominantly sexual mode of transmission: Secondary | ICD-10-CM

## 2022-06-09 ENCOUNTER — Other Ambulatory Visit: Payer: 59

## 2022-06-09 ENCOUNTER — Other Ambulatory Visit: Payer: Self-pay | Admitting: Internal Medicine

## 2022-06-09 ENCOUNTER — Other Ambulatory Visit (HOSPITAL_COMMUNITY)
Admission: RE | Admit: 2022-06-09 | Discharge: 2022-06-09 | Disposition: A | Discharge status: Home / Self Care | Payer: 59 | Source: Ambulatory Visit | Attending: Internal Medicine | Admitting: Internal Medicine

## 2022-06-09 ENCOUNTER — Other Ambulatory Visit: Payer: Self-pay

## 2022-06-09 ENCOUNTER — Other Ambulatory Visit (HOSPITAL_COMMUNITY): Payer: Self-pay

## 2022-06-09 DIAGNOSIS — Z113 Encounter for screening for infections with a predominantly sexual mode of transmission: Secondary | ICD-10-CM | POA: Diagnosis present

## 2022-06-09 DIAGNOSIS — Z79899 Other long term (current) drug therapy: Secondary | ICD-10-CM

## 2022-06-09 DIAGNOSIS — B20 Human immunodeficiency virus [HIV] disease: Secondary | ICD-10-CM | POA: Diagnosis present

## 2022-06-09 MED ORDER — TRIUMEQ 600-50-300 MG PO TABS
1.0000 | ORAL_TABLET | Freq: Every day | ORAL | 0 refills | Status: DC
  Filled 2022-06-09: qty 30, 30d supply, fill #0

## 2022-06-10 LAB — URINE CYTOLOGY ANCILLARY ONLY
Chlamydia: NEGATIVE
Comment: NEGATIVE
Comment: NORMAL
Neisseria Gonorrhea: NEGATIVE

## 2022-06-10 LAB — T-HELPER CELL (CD4) - (RCID CLINIC ONLY)
CD4 % Helper T Cell: 47 % (ref 33–65)
CD4 T Cell Abs: 897 /uL (ref 400–1790)

## 2022-06-12 LAB — CBC WITH DIFFERENTIAL/PLATELET
Absolute Monocytes: 320 cells/uL (ref 200–950)
Basophils Absolute: 27 cells/uL (ref 0–200)
Basophils Relative: 0.4 %
Eosinophils Absolute: 102 cells/uL (ref 15–500)
Eosinophils Relative: 1.5 %
HCT: 41.3 % (ref 35.0–45.0)
Hemoglobin: 14.2 g/dL (ref 11.7–15.5)
Lymphs Abs: 2067 cells/uL (ref 850–3900)
MCH: 30.7 pg (ref 27.0–33.0)
MCHC: 34.4 g/dL (ref 32.0–36.0)
MCV: 89.2 fL (ref 80.0–100.0)
MPV: 9.7 fL (ref 7.5–12.5)
Monocytes Relative: 4.7 %
Neutro Abs: 4284 cells/uL (ref 1500–7800)
Neutrophils Relative %: 63 %
Platelets: 365 10*3/uL (ref 140–400)
RBC: 4.63 10*6/uL (ref 3.80–5.10)
RDW: 12.5 % (ref 11.0–15.0)
Total Lymphocyte: 30.4 %
WBC: 6.8 10*3/uL (ref 3.8–10.8)

## 2022-06-12 LAB — HIV-1 RNA QUANT-NO REFLEX-BLD
HIV 1 RNA Quant: NOT DETECTED Copies/mL
HIV-1 RNA Quant, Log: NOT DETECTED Log cps/mL

## 2022-06-12 LAB — COMPLETE METABOLIC PANEL WITH GFR
AG Ratio: 1.3 (calc) (ref 1.0–2.5)
ALT: 13 U/L (ref 6–29)
AST: 15 U/L (ref 10–35)
Albumin: 4.3 g/dL (ref 3.6–5.1)
Alkaline phosphatase (APISO): 69 U/L (ref 37–153)
BUN/Creatinine Ratio: 13 (calc) (ref 6–22)
BUN: 14 mg/dL (ref 7–25)
CO2: 30 mmol/L (ref 20–32)
Calcium: 9.5 mg/dL (ref 8.6–10.4)
Chloride: 102 mmol/L (ref 98–110)
Creat: 1.08 mg/dL — ABNORMAL HIGH (ref 0.50–1.03)
Globulin: 3.3 g/dL (calc) (ref 1.9–3.7)
Glucose, Bld: 80 mg/dL (ref 65–99)
Potassium: 3.8 mmol/L (ref 3.5–5.3)
Sodium: 139 mmol/L (ref 135–146)
Total Bilirubin: 0.5 mg/dL (ref 0.2–1.2)
Total Protein: 7.6 g/dL (ref 6.1–8.1)
eGFR: 63 mL/min/{1.73_m2} (ref >=60)

## 2022-06-12 LAB — LIPID PANEL
Cholesterol: 166 mg/dL (ref <200)
HDL: 76 mg/dL (ref >=50)
LDL Cholesterol (Calc): 76 mg/dL (calc)
Non-HDL Cholesterol (Calc): 90 mg/dL (calc) (ref <130)
Total CHOL/HDL Ratio: 2.2 (calc) (ref <5.0)
Triglycerides: 55 mg/dL (ref <150)

## 2022-06-12 LAB — RPR: RPR Ser Ql: NONREACTIVE

## 2022-06-21 ENCOUNTER — Ambulatory Visit (INDEPENDENT_AMBULATORY_CARE_PROVIDER_SITE_OTHER): Payer: 59

## 2022-06-21 ENCOUNTER — Other Ambulatory Visit: Payer: Self-pay

## 2022-06-21 ENCOUNTER — Other Ambulatory Visit (HOSPITAL_COMMUNITY): Payer: Self-pay

## 2022-06-21 ENCOUNTER — Ambulatory Visit (INDEPENDENT_AMBULATORY_CARE_PROVIDER_SITE_OTHER): Payer: 59 | Admitting: Internal Medicine

## 2022-06-21 VITALS — BP 137/91 | HR 90 | Temp 97.7°F | Wt 192.4 lb

## 2022-06-21 DIAGNOSIS — B20 Human immunodeficiency virus [HIV] disease: Secondary | ICD-10-CM | POA: Diagnosis not present

## 2022-06-21 DIAGNOSIS — Z23 Encounter for immunization: Secondary | ICD-10-CM

## 2022-06-21 MED ORDER — TRIUMEQ 600-50-300 MG PO TABS: 1.0000 | ORAL_TABLET | Freq: Every day | ORAL | 11 refills | Status: DC

## 2022-06-21 NOTE — Assessment & Plan Note (Signed)
Her infection remains under excellent, long-term control.  She received her updated COVID-vaccine here today.  She will continue [name] and follow-up after lab work in 1 year.

## 2022-06-21 NOTE — Progress Notes (Signed)
Patient Active Problem List   Diagnosis Date Noted   Human immunodeficiency virus (HIV) disease ([place]) 01/10/2007    Priority: High   Iron deficiency anemia 08/06/2015    Priority: Medium    ASTHMA 08/10/2006    Priority: Medium    Benign essential hypertension 07/16/2020   Overweight 07/16/2020   Vitamin D deficiency 07/16/2020   Status post total hysterectomy 05/31/2019   Uterine fibroid 04/08/2019   Renal insufficiency 03/30/2017   History of shingles 04/21/2014   Myoma 04/21/2014   Sinusitis 02/04/2014   Acute maxillary sinusitis 08/14/2013   Eustachian tube dysfunction 08/14/2013   Acute nonsuppurative otitis media 08/09/2013   PROTEINURIA 07/21/2009   MYOMA 07/11/2007   VENEREAL WART 08/10/2006   Depression 08/10/2006   MENORRHAGIA 08/10/2006   SCOLIOSIS 08/10/2006   SHINGLES, HX OF 08/10/2006   Asthma 08/10/2006   Personal history of other specified conditions 08/10/2006    Patient's Medications  New Prescriptions   No medications on file  Previous Medications   ALBUTEROL (VENTOLIN HFA) 108 (90 BASE) MCG/ACT INHALER    Inhale 1-2 puffs into the lungs every 4 (four) hours as needed for wheezing or shortness of breath.    AMLODIPINE (NORVASC) 5 MG TABLET    Take 5 mg by mouth at bedtime.    COVID-19 MRNA VAC-TRIS, PFIZER, SUSP INJECTION    Inject into the muscle.   COVID-19 SPECIMEN COLLECTION KIT    TEST AS DIRECTED TODAY   FERROUS SULFATE 325 (65 FE) MG TABLET    Take 325 mg by mouth 2 (two) times a week.   FLUTICASONE (FLONASE) 50 MCG/ACT NASAL SPRAY    Place 2 sprays into both nostrils daily as needed for allergies or rhinitis.   IBUPROFEN (ADVIL) 800 MG TABLET    Take 1 tablet (800 mg total) by mouth every 6 (six) hours as needed.   LEVOCETIRIZINE (XYZAL) 5 MG TABLET    Take 5 mg by mouth at bedtime.    MELOXICAM (MOBIC) 7.5 MG TABLET    Take 1 tablet (7.5 mg total) by mouth daily.   MONTELUKAST (SINGULAIR) 10 MG TABLET    Take 10 mg by mouth at  bedtime.   NA SULFATE-K SULFATE-MG SULF (SUPREP BOWEL PREP KIT) 17.5-3.13-1.6 GM/177ML SOLN    See admin instructions.   OXYCODONE (OXY IR/ROXICODONE) 5 MG IMMEDIATE RELEASE TABLET    oxycodone 5 mg tablet  TAKE 1 TO 2 TABLETS BY MOUTH EVERY 4 HOURS AS NEEDED FOR MODERATE PAIN FOR UP TO 7 DAYS   SEMAGLUTIDE-WEIGHT MANAGEMENT (WEGOVY) 0.25 MG/0.5ML SOAJ    Inject 0.25 mg into the skin once a week.   TRIAMCINOLONE (KENALOG) 0.1 %    triamcinolone acetonide 0.1 % topical cream  APPLY THIN LAYER TOPICALLY TO THE AFFECTED AREA TWICE DAILY  Modified Medications   Modified Medication Previous Medication   ABACAVIR-DOLUTEGRAVIR-LAMIVUDINE ([name]) 600-50-300 MG TABLET abacavir-dolutegravir-lamiVUDine ([name]) 600-50-300 MG tablet      Take 1 tablet by mouth daily.    Take 1 tablet by mouth daily.  Discontinued Medications   No medications on file    Subjective: Bailey Morris is in for her routine HIV follow-up visit.  She has not had any problems obtaining, taking or tolerating her [name] and estimates that she has missed only 1-2 doses in the past year.  She tells me that her PCP has prescribed [facility] which she will start soon.  She is not on any other new medications.  She has been happily busy caring for her 7-year-old daughter, Bailey Morris, and with work.  She has had an annual influenza vaccine but she has not had an updated COVID-vaccine recently.  Review of Systems: Review of Systems  Constitutional:  Negative for fever and weight loss.  Psychiatric/Behavioral:  Negative for depression. The patient is not nervous/anxious.     Past Medical History:  Diagnosis Date   Anemia    Asthma    HIV positive ([facility])    Hypertension    PONV (postoperative nausea and vomiting)     Social History   Tobacco Use   Smoking status: Never   Smokeless tobacco: Never  Vaping Use   Vaping Use: Never used  Substance Use Topics   Alcohol use: Yes    Alcohol/week: 0.1 standard drinks of alcohol     Comment: socially   Drug use: No    No family history on file.  Allergies  Allergen Reactions   Sulfa Antibiotics Other (See Comments)    Severe joint pain   Advair Diskus [Fluticasone-Salmeterol] Anxiety    Made jitteriness     Health Maintenance  Topic Date Due   DTaP/Tdap/Td (1 - Tdap) Never done   Zoster Vaccines- Shingrix (1 of 2) Never done   COLONOSCOPY (Pts 45-49yrs Insurance coverage will need to be confirmed)  Never done   PAP SMEAR-Modifier  05/17/2019   MAMMOGRAM  06/01/2022   COVID-19 Vaccine (3 - Pfizer risk series) 07/19/2022   INFLUENZA VACCINE  Completed   Hepatitis C Screening  Completed   HIV Screening  Completed   HPV VACCINES  Aged Out    Objective:  Vitals:   06/21/22 1517  BP: (!) 137/91  Pulse: 90  Temp: 97.7 F (36.5 C)  TempSrc: Oral  SpO2: 100%  Weight: 192 lb 6.4 oz (87.3 kg)   Body mass index is 32.02 kg/m.  Physical Exam Constitutional:      Comments: She is in good spirits as usual.  Cardiovascular:     Rate and Rhythm: Normal rate.  Pulmonary:     Effort: Pulmonary effort is normal.  Psychiatric:        Mood and Affect: Mood normal.     Lab Results Lab Results  Component Value Date   WBC 6.8 06/09/2022   HGB 14.2 06/09/2022   HCT 41.3 06/09/2022   MCV 89.2 06/09/2022   PLT 365 06/09/2022    Lab Results  Component Value Date   CREATININE 1.08 (H) 06/09/2022   BUN 14 06/09/2022   NA 139 06/09/2022   K 3.8 06/09/2022   CL 102 06/09/2022   CO2 30 06/09/2022    Lab Results  Component Value Date   ALT 13 06/09/2022   AST 15 06/09/2022   ALKPHOS 60 05/28/2018   BILITOT 0.5 06/09/2022    Lab Results  Component Value Date   CHOL 166 06/09/2022   HDL 76 06/09/2022   LDLCALC 76 06/09/2022   TRIG 55 06/09/2022   CHOLHDL 2.2 06/09/2022   Lab Results  Component Value Date   LABRPR NON-REACTIVE 06/09/2022   HIV 1 RNA Quant (Copies/mL)  Date Value  06/09/2022 Not Detected  05/20/2021 Not Detected   05/20/2020 <20   CD4 T Cell Abs (/uL)  Date Value  06/09/2022 897  05/20/2021 793  05/20/2020 879     Problem List Items Addressed This Visit       High   Human immunodeficiency virus (HIV) disease (H[facility]) - Primary      Her infection remains under excellent, long-term control.  She received her updated COVID-vaccine here today.  She will continue [name] and follow-up after lab work in 1 year.      Relevant Medications   abacavir-dolutegravir-lamiVUDine ([name]) 600-50-300 MG tablet   Other Relevant Orders   CBC   T-helper cells (CD4) count (not at [facility])   Comprehensive metabolic panel   Lipid panel   RPR   HIV-1 RNA quant-no reflex-bld   Other Visit Diagnoses     Need for immunization against influenza       Relevant Orders   Flu Vaccine QUAD 49moIM (Fluarix, Fluzone & Alfiuria Quad PF) (Completed)         JMichel Bickers MD [facility] LLCfor [id number] [phone]   [phone] 06/21/2022, 4:57 PM

## 2022-06-24 ENCOUNTER — Other Ambulatory Visit (HOSPITAL_COMMUNITY): Payer: Self-pay

## 2022-07-04 ENCOUNTER — Other Ambulatory Visit (HOSPITAL_COMMUNITY): Payer: Self-pay

## 2022-07-19 ENCOUNTER — Other Ambulatory Visit (HOSPITAL_COMMUNITY): Payer: Self-pay

## 2022-07-20 ENCOUNTER — Other Ambulatory Visit (HOSPITAL_COMMUNITY): Payer: Self-pay

## 2022-07-21 ENCOUNTER — Other Ambulatory Visit (HOSPITAL_COMMUNITY): Payer: Self-pay

## 2022-07-21 ENCOUNTER — Other Ambulatory Visit: Payer: Self-pay | Admitting: Pharmacist

## 2022-07-21 DIAGNOSIS — B20 Human immunodeficiency virus [HIV] disease: Secondary | ICD-10-CM

## 2022-07-21 MED ORDER — TRIUMEQ 600-50-300 MG PO TABS
1.0000 | ORAL_TABLET | Freq: Every day | ORAL | 11 refills | Status: DC
  Filled 2022-07-21: qty 30, 30d supply, fill #0
  Filled 2022-08-22 (×2): qty 30, 30d supply, fill #1

## 2022-07-21 NOTE — Progress Notes (Signed)
Per [facility] pharmacist, patient requesting rx at [facility] instead of [facility] [place]. Resent script instead of transfer.  Alfonse Spruce, PharmD, CPP, BCIDP, [place] Clinical Pharmacist Practitioner [facility] [place] [facility]

## 2022-07-22 ENCOUNTER — Other Ambulatory Visit: Payer: Self-pay

## 2022-07-25 ENCOUNTER — Other Ambulatory Visit: Payer: Self-pay

## 2022-07-25 ENCOUNTER — Other Ambulatory Visit (HOSPITAL_COMMUNITY): Payer: Self-pay

## 2022-07-26 ENCOUNTER — Other Ambulatory Visit (HOSPITAL_COMMUNITY): Payer: Self-pay

## 2022-07-26 ENCOUNTER — Encounter (HOSPITAL_COMMUNITY): Payer: Self-pay

## 2022-07-28 ENCOUNTER — Other Ambulatory Visit: Payer: Self-pay

## 2022-07-28 ENCOUNTER — Other Ambulatory Visit (HOSPITAL_COMMUNITY): Payer: Self-pay

## 2022-08-01 ENCOUNTER — Other Ambulatory Visit: Payer: Self-pay

## 2022-08-09 ENCOUNTER — Other Ambulatory Visit (HOSPITAL_COMMUNITY): Payer: Self-pay

## 2022-08-22 ENCOUNTER — Other Ambulatory Visit (HOSPITAL_COMMUNITY): Payer: Self-pay

## 2022-08-23 ENCOUNTER — Other Ambulatory Visit (HOSPITAL_COMMUNITY): Payer: Self-pay

## 2022-08-24 ENCOUNTER — Other Ambulatory Visit (HOSPITAL_COMMUNITY): Payer: Self-pay

## 2022-08-24 ENCOUNTER — Other Ambulatory Visit: Payer: Self-pay

## 2022-08-24 ENCOUNTER — Encounter: Payer: Self-pay | Admitting: Internal Medicine

## 2022-08-24 DIAGNOSIS — B20 Human immunodeficiency virus [HIV] disease: Secondary | ICD-10-CM

## 2022-08-24 MED ORDER — TRIUMEQ 600-50-300 MG PO TABS: 1.0000 | ORAL_TABLET | Freq: Every day | ORAL | 11 refills | Status: DC

## 2022-08-25 ENCOUNTER — Other Ambulatory Visit: Payer: Self-pay

## 2022-08-25 ENCOUNTER — Other Ambulatory Visit (HOSPITAL_COMMUNITY): Payer: Self-pay

## 2022-10-17 ENCOUNTER — Other Ambulatory Visit (HOSPITAL_COMMUNITY): Payer: Self-pay

## 2022-10-17 MED ORDER — WEGOVY 0.5 MG/0.5ML ~~LOC~~ SOAJ
0.5000 mg | SUBCUTANEOUS | 1 refills | Status: DC
  Filled 2022-10-17 – 2023-02-15 (×3): qty 2, 28d supply, fill #0
  Filled 2023-03-06 – 2023-03-13 (×2): qty 2, 28d supply, fill #1

## 2022-10-18 ENCOUNTER — Other Ambulatory Visit (HOSPITAL_COMMUNITY): Payer: Self-pay

## 2022-11-04 ENCOUNTER — Other Ambulatory Visit (HOSPITAL_COMMUNITY): Payer: Self-pay

## 2022-11-08 ENCOUNTER — Other Ambulatory Visit (HOSPITAL_COMMUNITY): Payer: Self-pay

## 2022-11-08 ENCOUNTER — Other Ambulatory Visit: Payer: Self-pay

## 2022-11-15 ENCOUNTER — Other Ambulatory Visit (HOSPITAL_COMMUNITY): Payer: Self-pay

## 2023-02-15 ENCOUNTER — Other Ambulatory Visit (HOSPITAL_COMMUNITY): Payer: Self-pay

## 2023-03-06 ENCOUNTER — Other Ambulatory Visit (HOSPITAL_COMMUNITY): Payer: Self-pay

## 2023-03-13 ENCOUNTER — Other Ambulatory Visit (HOSPITAL_COMMUNITY): Payer: Self-pay

## 2023-03-16 ENCOUNTER — Other Ambulatory Visit (HOSPITAL_COMMUNITY): Payer: Self-pay

## 2023-03-16 MED ORDER — WEGOVY 0.5 MG/0.5ML ~~LOC~~ SOAJ
0.5000 mg | SUBCUTANEOUS | 1 refills | Status: DC
  Filled 2023-03-16 – 2023-04-26 (×4): qty 2, 28d supply, fill #0
  Filled 2023-05-24: qty 2, 28d supply, fill #1

## 2023-04-06 ENCOUNTER — Other Ambulatory Visit (HOSPITAL_COMMUNITY): Payer: Self-pay

## 2023-04-10 ENCOUNTER — Other Ambulatory Visit: Payer: Self-pay

## 2023-04-12 ENCOUNTER — Other Ambulatory Visit: Payer: Self-pay

## 2023-04-13 ENCOUNTER — Other Ambulatory Visit: Payer: Self-pay

## 2023-04-13 ENCOUNTER — Other Ambulatory Visit (HOSPITAL_COMMUNITY): Payer: Self-pay

## 2023-04-17 ENCOUNTER — Other Ambulatory Visit: Payer: Self-pay

## 2023-04-17 ENCOUNTER — Other Ambulatory Visit (HOSPITAL_COMMUNITY): Payer: Self-pay

## 2023-04-18 ENCOUNTER — Other Ambulatory Visit: Payer: Self-pay

## 2023-04-19 ENCOUNTER — Encounter: Payer: Self-pay | Admitting: Pharmacist

## 2023-04-19 ENCOUNTER — Other Ambulatory Visit: Payer: Self-pay

## 2023-04-21 ENCOUNTER — Other Ambulatory Visit: Payer: Self-pay

## 2023-04-26 ENCOUNTER — Other Ambulatory Visit: Payer: Self-pay

## 2023-04-26 ENCOUNTER — Other Ambulatory Visit (HOSPITAL_COMMUNITY): Payer: Self-pay

## 2023-05-24 ENCOUNTER — Other Ambulatory Visit (HOSPITAL_COMMUNITY): Payer: Self-pay

## 2023-05-25 ENCOUNTER — Other Ambulatory Visit (HOSPITAL_COMMUNITY): Payer: Self-pay

## 2023-05-25 ENCOUNTER — Other Ambulatory Visit: Payer: Self-pay

## 2023-05-25 ENCOUNTER — Other Ambulatory Visit (HOSPITAL_BASED_OUTPATIENT_CLINIC_OR_DEPARTMENT_OTHER): Payer: Self-pay

## 2023-05-25 MED ORDER — WEGOVY 1 MG/0.5ML ~~LOC~~ SOAJ
1.0000 mg | SUBCUTANEOUS | 3 refills | Status: DC
  Filled 2023-05-25: qty 2, 28d supply, fill #0
  Filled 2023-06-23 (×2): qty 2, 28d supply, fill #1
  Filled 2023-07-26: qty 2, 28d supply, fill #2
  Filled 2023-08-28: qty 2, 28d supply, fill #3

## 2023-05-26 ENCOUNTER — Other Ambulatory Visit: Payer: Self-pay

## 2023-06-23 ENCOUNTER — Other Ambulatory Visit (HOSPITAL_COMMUNITY): Payer: Self-pay

## 2023-07-25 ENCOUNTER — Telehealth: Payer: Self-pay

## 2023-07-25 NOTE — Telephone Encounter (Signed)
 Patient last seen 06/21/22 by Dr. Elaine. Called to schedule appointment, no answer. Left HIPAA compliant voicemail requesting callback.   Medication being dispensed consistently:  Dispensed Days Supply Quantity Provider Pharmacy   [name]  TAB (ABC600/DTG50/3TC 300) 07/19/2023 30 30 each Comer, Lamar ORN, MD [facility] DR[facility] STORE #...  [name]  TAB (ABC600/DTG50/3TC 300) 06/23/2023 30 30 each Comer, Lamar ORN, MD [facility] DRUG STORE #...  [name]  TAB (ABC600/DTG50/3TC 300) 05/14/2023 30 30 each Comer, Lamar ORN, MD [facility] DRUG STORE #...  [name]  TAB (ABC600/DTG50/3TC 300) 04/19/2023 30 30 each Comer, Lamar ORN, MD [facility] DRUG STORE #...     Latest Reference Range & Units 06/09/22 08:48  HIV 1 RNA Quant Copies/mL Not Detected    [place] JONETTA Lowe, RN

## 2023-07-26 ENCOUNTER — Other Ambulatory Visit (HOSPITAL_COMMUNITY): Payer: Self-pay

## 2023-08-28 ENCOUNTER — Other Ambulatory Visit (HOSPITAL_COMMUNITY): Payer: Self-pay

## 2023-08-29 ENCOUNTER — Ambulatory Visit: Payer: Self-pay | Admitting: Infectious Diseases

## 2023-09-04 ENCOUNTER — Other Ambulatory Visit: Payer: Self-pay

## 2023-09-04 DIAGNOSIS — Z113 Encounter for screening for infections with a predominantly sexual mode of transmission: Secondary | ICD-10-CM

## 2023-09-04 DIAGNOSIS — Z79899 Other long term (current) drug therapy: Secondary | ICD-10-CM

## 2023-09-04 DIAGNOSIS — B20 Human immunodeficiency virus [HIV] disease: Secondary | ICD-10-CM

## 2023-09-05 ENCOUNTER — Other Ambulatory Visit: Payer: Self-pay

## 2023-09-11 ENCOUNTER — Other Ambulatory Visit (HOSPITAL_COMMUNITY): Payer: Self-pay

## 2023-09-11 MED ORDER — DESONIDE 0.05 % EX CREA
1.0000 | TOPICAL_CREAM | Freq: Two times a day (BID) | CUTANEOUS | 5 refills | Status: AC
  Filled 2023-09-11: qty 60, 30d supply, fill #0

## 2023-09-11 MED ORDER — WEGOVY 1 MG/0.5ML ~~LOC~~ SOAJ
1.0000 mg | SUBCUTANEOUS | 3 refills | Status: DC
  Filled 2023-09-11 – 2023-09-29 (×4): qty 2, 28d supply, fill #0

## 2023-09-11 MED ORDER — TRIAMCINOLONE ACETONIDE 0.1 % EX CREA
1.0000 | TOPICAL_CREAM | Freq: Two times a day (BID) | CUTANEOUS | 5 refills | Status: AC
  Filled 2023-09-11: qty 30, 15d supply, fill #0

## 2023-09-12 ENCOUNTER — Ambulatory Visit: Payer: Self-pay | Admitting: Family

## 2023-09-18 ENCOUNTER — Other Ambulatory Visit (HOSPITAL_COMMUNITY): Payer: Self-pay

## 2023-09-18 ENCOUNTER — Other Ambulatory Visit: Payer: Self-pay

## 2023-09-18 DIAGNOSIS — Z113 Encounter for screening for infections with a predominantly sexual mode of transmission: Secondary | ICD-10-CM

## 2023-09-18 DIAGNOSIS — B20 Human immunodeficiency virus [HIV] disease: Secondary | ICD-10-CM

## 2023-09-19 ENCOUNTER — Other Ambulatory Visit: Payer: Self-pay | Admitting: Internal Medicine

## 2023-09-19 ENCOUNTER — Other Ambulatory Visit (HOSPITAL_COMMUNITY): Payer: Self-pay

## 2023-09-19 DIAGNOSIS — B20 Human immunodeficiency virus [HIV] disease: Secondary | ICD-10-CM

## 2023-09-19 LAB — C. TRACHOMATIS/N. GONORRHOEAE RNA
C. trachomatis RNA, TMA: NOT DETECTED
N. gonorrhoeae RNA, TMA: NOT DETECTED

## 2023-09-20 LAB — CBC WITH DIFFERENTIAL/PLATELET
Absolute Lymphocytes: 1733 {cells}/uL (ref 850–3900)
Absolute Monocytes: 285 {cells}/uL (ref 200–950)
Basophils Absolute: 40 {cells}/uL (ref 0–200)
Basophils Relative: 0.7 %
Eosinophils Absolute: 143 {cells}/uL (ref 15–500)
Eosinophils Relative: 2.5 %
HCT: 40.5 % (ref 35.0–45.0)
Hemoglobin: 13.6 g/dL (ref 11.7–15.5)
MCH: 30.5 pg (ref 27.0–33.0)
MCHC: 33.6 g/dL (ref 32.0–36.0)
MCV: 90.8 fL (ref 80.0–100.0)
MPV: 10.3 fL (ref 7.5–12.5)
Monocytes Relative: 5 %
Neutro Abs: 3500 {cells}/uL (ref 1500–7800)
Neutrophils Relative %: 61.4 %
Platelets: 301 10*3/uL (ref 140–400)
RBC: 4.46 10*6/uL (ref 3.80–5.10)
RDW: 13.1 % (ref 11.0–15.0)
Total Lymphocyte: 30.4 %
WBC: 5.7 10*3/uL (ref 3.8–10.8)

## 2023-09-20 LAB — LIPID PANEL
Cholesterol: 159 mg/dL (ref <200)
HDL: 62 mg/dL (ref >=50)
LDL Cholesterol (Calc): 81 mg/dL
Non-HDL Cholesterol (Calc): 97 mg/dL (ref <130)
Total CHOL/HDL Ratio: 2.6 (calc) (ref <5.0)
Triglycerides: 82 mg/dL (ref <150)

## 2023-09-20 LAB — COMPLETE METABOLIC PANEL WITHOUT GFR
AG Ratio: 1.4 (calc) (ref 1.0–2.5)
ALT: 10 U/L (ref 6–29)
AST: 14 U/L (ref 10–35)
Albumin: 4.2 g/dL (ref 3.6–5.1)
Alkaline phosphatase (APISO): 82 U/L (ref 37–153)
BUN/Creatinine Ratio: 13 (calc) (ref 6–22)
BUN: 15 mg/dL (ref 7–25)
CO2: 26 mmol/L (ref 20–32)
Calcium: 9.5 mg/dL (ref 8.6–10.4)
Chloride: 104 mmol/L (ref 98–110)
Creat: 1.14 mg/dL — ABNORMAL HIGH (ref 0.50–1.03)
Globulin: 3.1 g/dL (ref 1.9–3.7)
Glucose, Bld: 76 mg/dL (ref 65–99)
Potassium: 3.6 mmol/L (ref 3.5–5.3)
Sodium: 141 mmol/L (ref 135–146)
Total Bilirubin: 0.4 mg/dL (ref 0.2–1.2)
Total Protein: 7.3 g/dL (ref 6.1–8.1)

## 2023-09-20 LAB — T-HELPER CELLS (CD4) COUNT (NOT AT ARMC)
Absolute CD4: 865 {cells}/uL (ref 490–1740)
CD4 T Helper %: 46 % (ref 30–61)
Total lymphocyte count: 1875 {cells}/uL (ref 850–3900)

## 2023-09-20 LAB — HIV-1 RNA QUANT-NO REFLEX-BLD
HIV 1 RNA Quant: NOT DETECTED {copies}/mL
HIV-1 RNA Quant, Log: NOT DETECTED {Log_copies}/mL

## 2023-09-20 LAB — RPR: RPR Ser Ql: NONREACTIVE

## 2023-09-26 ENCOUNTER — Other Ambulatory Visit: Payer: Self-pay

## 2023-09-26 ENCOUNTER — Other Ambulatory Visit: Payer: Self-pay | Admitting: Pharmacist

## 2023-09-26 ENCOUNTER — Other Ambulatory Visit (HOSPITAL_COMMUNITY): Payer: Self-pay

## 2023-09-26 ENCOUNTER — Encounter: Payer: Self-pay | Admitting: Infectious Diseases

## 2023-09-26 ENCOUNTER — Ambulatory Visit: Payer: Self-pay | Admitting: Infectious Diseases

## 2023-09-26 DIAGNOSIS — E785 Hyperlipidemia, unspecified: Secondary | ICD-10-CM | POA: Diagnosis not present

## 2023-09-26 DIAGNOSIS — I10 Essential (primary) hypertension: Secondary | ICD-10-CM | POA: Diagnosis not present

## 2023-09-26 DIAGNOSIS — N951 Menopausal and female climacteric states: Secondary | ICD-10-CM | POA: Diagnosis not present

## 2023-09-26 DIAGNOSIS — B20 Human immunodeficiency virus [HIV] disease: Secondary | ICD-10-CM

## 2023-09-26 MED ORDER — TRIUMEQ 600-50-300 MG PO TABS
1.0000 | ORAL_TABLET | Freq: Every day | ORAL | 11 refills | Status: AC
  Filled 2023-09-26: qty 30, 30d supply, fill #0
  Filled 2023-11-03: qty 30, 30d supply, fill #1
  Filled 2023-12-04 – 2023-12-06 (×2): qty 30, 30d supply, fill #2
  Filled 2024-01-09: qty 30, 30d supply, fill #3
  Filled 2024-02-01 – 2024-02-05 (×2): qty 30, 30d supply, fill #4
  Filled 2024-03-04: qty 30, 30d supply, fill #5
  Filled 2024-04-01: qty 30, 30d supply, fill #6
  Filled 2024-05-01: qty 30, 30d supply, fill #7
  Filled 2024-05-29 – 2024-05-31 (×3): qty 30, 30d supply, fill #8
  Filled 2024-06-28: qty 30, 30d supply, fill #9
  Filled 2024-07-26: qty 30, 30d supply, fill #10

## 2023-09-26 NOTE — Progress Notes (Signed)
 Specialty Pharmacy Initial Fill Coordination Note  Bailey Morris is a 53 y.o. female contacted today regarding initial fill of specialty medication(s) Abacavir-Dolutegravir-Lamivud ([name])   Patient requested Delivery   Delivery date: 10/16/23   Verified address: [address]  [place] [address]   Medication will be filled on 10/13/23.   Patient is aware of $0 copayment.

## 2023-09-26 NOTE — Patient Instructions (Addendum)
 Separate the [name] from any supplements by at least 6 hours to ensure they are both.   Galvastan Diet may be helpful.  I would say to consider talking to your other doctor about whether you should consider hormone replacement therapy to help with the flashes / sweats.  I would think it would be cheaper

## 2023-09-26 NOTE — Progress Notes (Signed)
 Name: Bailey Morris  DOB: 10/18/1970 MRN: [id number] PCP: Andi Devon, MD    Brief Narrative:  Bailey Morris is a 53 y.o. female with HIV, Stage unknown, diagnosed on 2001 per chart records. CD4 nadir unknown VL unknown Transmission Risk: sexual History of OIs: none History of STIs: Hep B sAg (-), sAb (+), cAb (-); Hep A (+), Hep C (-) Quantiferon (-) HLA B*5701 () G6PD: ()   Previous Regimens: Atripla 2008, 2014- 2016 Prezista/r + Truvada  [name] 2017   Genotypes: Not on file   Subjective   Subjective:   Chief Complaint  Patient presents with   Follow-up     Discussed the use of AI scribe software for clinical note transcription with the patient, who gave verbal consent to proceed.  History of Present Illness   Bailey Morris is a 53 year old female with HIV who presents for routine follow-up. Previously followed by my recently retired partner Dr. Orvan Falconer - she has followed with him since 2001.   She is managing her HIV with [name], which has been effective in maintaining her immune function. Her CD4 count is strong at 865, consistently above 500, and she has not experienced any recent infections or complications related to her HIV. She has been on [name] for several years without any side effects. She was previously on Atripla and boosted PI regimens.   She is experiencing menopause symptoms, particularly hot flashes that began about a year ago. Initially severe, these symptoms have decreased in intensity. She underwent a hysterectomy approximately one and a half to two years ago due to high blood pressure and other personal reasons. She has tried various treatments for her menopause symptoms, including Estroven and a trial of Veozah, which was effective but discontinued due to insurance issues. Gabapentin was also tried without much success.   Her blood pressure is slightly elevated today, although she does not regularly monitor it at home. She is  currently taking amlodipine for blood pressure management. She has a history of stress, particularly related to her work in [employer] traffic [employer], which she finds stressful.    She has a history of high cholesterol, but her recent cholesterol panel shows normal levels. She is mindful of her diet, particularly in managing her menopause symptoms.  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 She is also trying to maintain her weight and is mindful of her diet.          09/26/2023    1:37 PM  Depression screen PHQ 2/9  Decreased Interest 0  Down, Depressed, Hopeless 0  PHQ - 2 Score 0    Review of Systems  Constitutional:  Negative for chills and fever.  HENT:  Negative for tinnitus.   Eyes:  Negative for blurred vision and photophobia.  Respiratory:  Negative for cough and sputum production.   Cardiovascular:  Negative for chest pain.  Gastrointestinal:  Negative for diarrhea, nausea and vomiting.  Genitourinary:  Negative for dysuria.  Skin:  Negative for rash.  Neurological:  Negative for headaches.     Past Medical History:  Diagnosis Date   Anemia    Asthma    HIV positive (HCC)    Hypertension    PONV (postoperative nausea and vomiting)     Outpatient Medications Prior to Visit  Medication Sig Dispense Refill   albuterol (VENTOLIN HFA) 108 (90 Base) MCG/ACT inhaler Inhale 1-2 puffs  into the lungs every 4 (four) hours as needed for wheezing or shortness of breath.      COVID-19 mRNA Vac-TriS, Pfizer, SUSP injection Inject into the muscle. 0.3 mL 0   desonide (DESOWEN) 0.05 % cream Apply 1 Application and rub gently into affected area(s) topically 2 (two) times daily. 60 g 5   ibuprofen (ADVIL) 800 MG tablet Take 1 tablet (800 mg total) by mouth every 6 (six) hours as needed. 30 tablet 5   levocetirizine (XYZAL) 5 MG tablet Take 5 mg by mouth at bedtime.      Rhubarb (ESTROVEN COMPLETE PO) Take 1  Capful by mouth daily.     Semaglutide-Weight Management (WEGOVY) 0.25 MG/0.5ML SOAJ Inject 0.25 mg into the skin once a week. 2 mL 1   Semaglutide-Weight Management (WEGOVY) 1 MG/0.5ML SOAJ Inject 1 mg into the skin once a week. 2 mL 3   triamcinolone (KENALOG) 0.1 % triamcinolone acetonide 0.1 % topical cream  APPLY THIN LAYER TOPICALLY TO THE AFFECTED AREA TWICE DAILY     [name] 600-50-300 MG tablet TAKE 1 TABLET BY MOUTH DAILY 30 tablet 0   amLODipine (NORVASC) 5 MG tablet Take 5 mg by mouth at bedtime.      ferrous sulfate 325 (65 FE) MG tablet Take 325 mg by mouth 2 (two) times a week. (Patient not taking: Reported on 06/21/2022)     fluticasone (FLONASE) 50 MCG/ACT nasal spray Place 2 sprays into both nostrils daily as needed for allergies or rhinitis.     meloxicam (MOBIC) 7.5 MG tablet Take 1 tablet (7.5 mg total) by mouth daily. (Patient not taking: Reported on 09/26/2023) 30 tablet 0   montelukast (SINGULAIR) 10 MG tablet Take 10 mg by mouth at bedtime. (Patient not taking: Reported on 09/26/2023)     Na Sulfate-K Sulfate-Mg Sulf (SUPREP BOWEL PREP KIT) 17.5-3.13-1.6 GM/177ML SOLN See admin instructions. (Patient not taking: Reported on 09/26/2023)     oxyCODONE (OXY IR/ROXICODONE) 5 MG immediate release tablet oxycodone 5 mg tablet  TAKE 1 TO 2 TABLETS BY MOUTH EVERY 4 HOURS AS NEEDED FOR MODERATE PAIN FOR UP TO 7 DAYS (Patient not taking: Reported on 09/26/2023)     triamcinolone cream (KENALOG) 0.1 % Apply 1 Application topically 2 (two) times daily. (Patient not taking: Reported on 09/26/2023) 30 g 5   COVID-19 Specimen Collection KIT TEST AS DIRECTED TODAY (Patient not taking: Reported on 09/26/2023)     No facility-administered medications prior to visit.     Allergies  Allergen Reactions   Sulfa Antibiotics Other (See Comments)    Severe joint pain   Advair Diskus [Fluticasone-Salmeterol] Anxiety    Made jitteriness     Social History   Tobacco Use   Smoking status: Never    Smokeless tobacco: Never  Vaping Use   Vaping status: Never Used  Substance Use Topics   Alcohol use: Yes    Alcohol/week: 0.1 standard drinks of alcohol    Comment: socially   Drug use: No    History reviewed. No pertinent family history.  Social History   Substance and Sexual Activity  Sexual Activity Not on file        Objective   Objective:   Vitals:   09/26/23 1333 09/26/23 1417  BP: (!) 140/97 (!) 140/98  Pulse: 82   Resp: 16   Weight: 171 lb 12.8 oz (77.9 kg)   Height: 5\' 5"  (1.651 m)    Body mass index is 28.59 kg/m.  Physical Exam Constitutional:  Appearance: Normal appearance. She is not ill-appearing.  HENT:     Mouth/Throat:     Mouth: Mucous membranes are moist.     Pharynx: Oropharynx is clear.  Eyes:     General: No scleral icterus. Cardiovascular:     Rate and Rhythm: Normal rate.  Pulmonary:     Effort: Pulmonary effort is normal.  Neurological:     Mental Status: She is oriented to person, place, and time.  Psychiatric:        Mood and Affect: Mood normal.        Thought Content: Thought content normal.       Assessment & Plan:     HIV infection - Good Virologic Control with CD4 > 500 -  HIV well-controlled with [name]. CD4 count 865. Slightly elevated creatinine likely due to [name], stable kidney function. Advised separation of [name] and calcium supplements by six hours.  - Continue [name] with refills for one year. - Monitor kidney function periodically - suspect synthetic bump in creatinine with dolutegravir.  - Ensure [name] taken separately from calcium supplements by six hours.  Hypertension - Managed with amlodipine. Slightly elevated blood pressure, possibly stress-related. Home monitoring recommended. - Continue amlodipine. - Encourage home blood pressure monitoring. - Follow   Hyperlipidemia - Cholesterol levels healthy, HDL 62, normal triglycerides. No diabetes risk. Regular exercise and balanced diet  advised. - Continue current lifestyle and dietary habits.  Menopause Symptoms -  Symptoms include hot flashes, decreased severity. Tried Estroven, Veozah, gabapentin. Veozah effective but not covered. Hormone replacement therapy not yet explored. - Consider hormone replacement therapy. - Explore [place] diet for menopausal nutrition. - Encourage regular exercise and healthy diet. - Consider calcium and vitamin D supplementation, ensuring separation from [name] intake.        Orders Placed This Encounter  Procedures   CBC    Standing Status:   Future    Expected Date:   09/25/2024    Expiration Date:   09/25/2024   COMPLETE METABOLIC PANEL WITHOUT GFR    Standing Status:   Future    Expected Date:   09/25/2024    Expiration Date:   09/25/2024   RPR    Standing Status:   Future    Expected Date:   09/25/2024    Expiration Date:   09/25/2024   HIV 1 RNA quant-no reflex-bld    Standing Status:   Future    Expected Date:   09/25/2024    Expiration Date:   09/25/2024   T-helper cells (CD4) count    Standing Status:   Future    Expected Date:   09/25/2024    Expiration Date:   09/25/2024   Lipid panel    Standing Status:   Future    Expected Date:   09/25/2024    Expiration Date:   09/25/2024    Meds ordered this encounter  Medications   abacavir-dolutegravir-lamiVUDine ([name]) 600-50-300 MG tablet    Sig: Take 1 tablet by mouth daily.    Dispense:  30 tablet    Refill:  11    Patient requests this to be mailed    Supervising Provider:   Judyann Munson [id number]    Prescription Type::   Renewal    Return in about 1 year (around 09/25/2024).   Rexene Alberts, MSN, NP-C [facility] for Infectious Disease [facility]  [email or web address] Pager: [phone] Office: [phone] [facility] Main Line: [phone] *Secure Chat Communication Welcome

## 2023-09-26 NOTE — Progress Notes (Signed)
 Specialty Pharmacy Initiation Note   Bailey Morris is a 53 y.o. female who will be followed by the specialty pharmacy service for RxSp HIV    Review of administration, indication, effectiveness, safety, potential side effects, storage/disposable, and missed dose instructions occurred today for patient's specialty medication(s) Abacavir-Dolutegravir-Lamivud ([name])     Patient/Caregiver did not have any additional questions or concerns.   Patient's therapy is appropriate to: Continue    Goals Addressed             This Visit's Progress    Achieve Undetectable HIV Viral Load < 20       Patient is on track. Patient will maintain adherence         Jennette Kettle Specialty Pharmacist

## 2023-09-28 ENCOUNTER — Other Ambulatory Visit (HOSPITAL_COMMUNITY): Payer: Self-pay

## 2023-09-28 MED ORDER — WEGOVY 1.7 MG/0.75ML ~~LOC~~ SOAJ
1.7000 mg | SUBCUTANEOUS | 3 refills | Status: DC
Start: 2023-09-28 — End: 2024-02-29
  Filled 2023-09-28 – 2023-10-05 (×4): qty 3, 28d supply, fill #0
  Filled 2023-11-03: qty 3, 28d supply, fill #1
  Filled 2023-12-04: qty 3, 28d supply, fill #0
  Filled 2024-01-18: qty 3, 28d supply, fill #1

## 2023-09-29 ENCOUNTER — Other Ambulatory Visit (HOSPITAL_COMMUNITY): Payer: Self-pay

## 2023-10-04 ENCOUNTER — Other Ambulatory Visit (HOSPITAL_COMMUNITY): Payer: Self-pay

## 2023-10-05 ENCOUNTER — Other Ambulatory Visit (HOSPITAL_COMMUNITY): Payer: Self-pay

## 2023-10-11 ENCOUNTER — Other Ambulatory Visit: Payer: Self-pay

## 2023-10-13 ENCOUNTER — Other Ambulatory Visit: Payer: Self-pay

## 2023-10-18 ENCOUNTER — Other Ambulatory Visit: Payer: Self-pay | Admitting: Infectious Diseases

## 2023-10-18 DIAGNOSIS — B20 Human immunodeficiency virus [HIV] disease: Secondary | ICD-10-CM

## 2023-11-01 ENCOUNTER — Other Ambulatory Visit: Payer: Self-pay

## 2023-11-03 ENCOUNTER — Other Ambulatory Visit: Payer: Self-pay

## 2023-11-03 ENCOUNTER — Other Ambulatory Visit (HOSPITAL_COMMUNITY): Payer: Self-pay

## 2023-11-03 NOTE — Progress Notes (Signed)
 Specialty Pharmacy Refill Coordination Note  Bailey Morris is a 53 y.o. female contacted today regarding refills of specialty medication(s) Abacavir -Dolutegravir -Lamivud ([name] )   Patient requested Delivery   Delivery date: 11/10/23   Verified address: [address]  [place] [address]   Medication will be filled on 11/09/23.

## 2023-11-06 ENCOUNTER — Other Ambulatory Visit (HOSPITAL_COMMUNITY): Payer: Self-pay

## 2023-11-06 NOTE — Progress Notes (Signed)
 The 10-year ASCVD risk score (Arnett DK, et al., 2019) is: 3.7%   Values used to calculate the score:     Age: 53 years     Sex: Female     Is Non-Hispanic African American: Yes     Diabetic: No     Tobacco smoker: No     Systolic Blood Pressure: 140 mmHg     Is BP treated: Yes     HDL Cholesterol: 62 mg/dL     Total Cholesterol: 159 mg/dL  Arlon Bergamo, BSN, RN

## 2023-12-04 ENCOUNTER — Other Ambulatory Visit (HOSPITAL_BASED_OUTPATIENT_CLINIC_OR_DEPARTMENT_OTHER): Payer: Self-pay

## 2023-12-04 ENCOUNTER — Other Ambulatory Visit: Payer: Self-pay

## 2023-12-04 ENCOUNTER — Other Ambulatory Visit (HOSPITAL_COMMUNITY): Payer: Self-pay

## 2023-12-06 ENCOUNTER — Other Ambulatory Visit: Payer: Self-pay

## 2023-12-08 ENCOUNTER — Other Ambulatory Visit: Payer: Self-pay

## 2023-12-08 NOTE — Progress Notes (Signed)
 Specialty Pharmacy Refill Coordination Note  Bailey Morris is a 53 y.o. female contacted today regarding refills of specialty medication(s) Abacavir -Dolutegravir -Lamivud ([name] )   Patient requested Delivery   Delivery date: 12/13/23   Verified address: [address]  [place] [address]   Medication will be filled on 12/12/23.

## 2024-01-09 ENCOUNTER — Other Ambulatory Visit: Payer: Self-pay

## 2024-01-09 ENCOUNTER — Encounter (INDEPENDENT_AMBULATORY_CARE_PROVIDER_SITE_OTHER): Payer: Self-pay

## 2024-01-09 NOTE — Progress Notes (Signed)
 Specialty Pharmacy Refill Coordination Note  Bailey Morris is a 53 y.o. female contacted today regarding refills of specialty medication(s) Abacavir -Dolutegravir -Lamivud ([name] )   Patient requested (Patient-Rptd) Delivery   Delivery date: 01/10/24   Verified address: (Patient-Rptd) [address] [place], [place] [address]   Medication will be filled on 07.22.25.

## 2024-01-18 ENCOUNTER — Other Ambulatory Visit (HOSPITAL_BASED_OUTPATIENT_CLINIC_OR_DEPARTMENT_OTHER): Payer: Self-pay

## 2024-02-01 ENCOUNTER — Other Ambulatory Visit: Payer: Self-pay

## 2024-02-05 ENCOUNTER — Other Ambulatory Visit: Payer: Self-pay

## 2024-02-07 ENCOUNTER — Other Ambulatory Visit: Payer: Self-pay

## 2024-02-07 ENCOUNTER — Other Ambulatory Visit (HOSPITAL_COMMUNITY): Payer: Self-pay

## 2024-02-07 NOTE — Progress Notes (Signed)
 Specialty Pharmacy Refill Coordination Note  Spoke with Aneri A Spivey  Bailey Morris is a 53 y.o. female contacted today regarding refills of specialty medication(s) Abacavir -Dolutegravir -Lamivud ([name] )  Doses on hand: 7  Patient requested: Delivery   Delivery date: 02/09/24   Verified address: [address] [place] [address]  Medication will be filled on 02/08/24.

## 2024-02-29 ENCOUNTER — Other Ambulatory Visit (HOSPITAL_COMMUNITY): Payer: Self-pay

## 2024-02-29 MED ORDER — MELOXICAM 15 MG PO TABS
15.0000 mg | ORAL_TABLET | Freq: Every day | ORAL | 1 refills | Status: AC | PRN
  Filled 2024-02-29 – 2024-03-18 (×2): qty 30, 30d supply, fill #0

## 2024-03-04 ENCOUNTER — Other Ambulatory Visit: Payer: Self-pay

## 2024-03-04 NOTE — Progress Notes (Signed)
 Specialty Pharmacy Refill Coordination Note  Bailey Morris is a 53 y.o. female contacted today regarding refills of specialty medication(s) Abacavir -Dolutegravir -Lamivud ([name] )   Patient requested Delivery   Delivery date: 03/06/24   Verified address: [address] [place] [address]   Medication will be filled on 03/05/24.

## 2024-03-05 ENCOUNTER — Other Ambulatory Visit: Payer: Self-pay

## 2024-03-11 ENCOUNTER — Other Ambulatory Visit (HOSPITAL_COMMUNITY): Payer: Self-pay

## 2024-03-13 ENCOUNTER — Other Ambulatory Visit: Payer: Self-pay | Admitting: Obstetrics and Gynecology

## 2024-03-13 DIAGNOSIS — N6489 Other specified disorders of breast: Secondary | ICD-10-CM

## 2024-03-18 ENCOUNTER — Other Ambulatory Visit (HOSPITAL_COMMUNITY): Payer: Self-pay

## 2024-03-18 ENCOUNTER — Other Ambulatory Visit: Payer: Self-pay

## 2024-03-18 ENCOUNTER — Other Ambulatory Visit (HOSPITAL_BASED_OUTPATIENT_CLINIC_OR_DEPARTMENT_OTHER): Payer: Self-pay

## 2024-03-18 MED ORDER — WEGOVY 0.5 MG/0.5ML ~~LOC~~ SOAJ
0.5000 mg | SUBCUTANEOUS | 1 refills | Status: DC
  Filled 2024-03-18 (×2): qty 2, 28d supply, fill #0
  Filled 2024-04-26: qty 2, 28d supply, fill #1

## 2024-03-25 ENCOUNTER — Encounter

## 2024-04-01 ENCOUNTER — Other Ambulatory Visit: Payer: Self-pay

## 2024-04-01 ENCOUNTER — Other Ambulatory Visit (HOSPITAL_COMMUNITY): Payer: Self-pay

## 2024-04-01 NOTE — Progress Notes (Signed)
 Specialty Pharmacy Refill Coordination Note  Spoke with Kyley A Brining  Dillan A Pillsbury is a 53 y.o. female contacted today regarding refills of specialty medication(s) Abacavir -Dolutegravir -Lamivud ([name] )  Doses on hand: 9  Patient requested: Delivery   Delivery date: 04/05/24   Verified address: [address] [place] [address]  Medication will be filled on 04/04/24.

## 2024-04-03 ENCOUNTER — Other Ambulatory Visit: Payer: Self-pay

## 2024-04-03 ENCOUNTER — Encounter

## 2024-04-26 ENCOUNTER — Other Ambulatory Visit (HOSPITAL_BASED_OUTPATIENT_CLINIC_OR_DEPARTMENT_OTHER): Payer: Self-pay

## 2024-05-01 ENCOUNTER — Other Ambulatory Visit: Payer: Self-pay

## 2024-05-03 ENCOUNTER — Other Ambulatory Visit: Payer: Self-pay

## 2024-05-03 NOTE — Progress Notes (Signed)
 Specialty Pharmacy Refill Coordination Note  Bailey Morris is a 53 y.o. female contacted today regarding refills of specialty medication(s) Abacavir -Dolutegravir -Lamivud ([name] )   Patient requested Delivery   Delivery date: 05/08/24   Verified address: [address] [place] [address]   Medication will be filled on: 05/07/24

## 2024-05-29 ENCOUNTER — Other Ambulatory Visit: Payer: Self-pay

## 2024-05-30 ENCOUNTER — Other Ambulatory Visit: Payer: Self-pay

## 2024-05-31 ENCOUNTER — Other Ambulatory Visit: Payer: Self-pay

## 2024-06-03 ENCOUNTER — Other Ambulatory Visit: Payer: Self-pay

## 2024-06-03 ENCOUNTER — Other Ambulatory Visit (HOSPITAL_COMMUNITY): Payer: Self-pay

## 2024-06-03 MED ORDER — WEGOVY 1 MG/0.5ML ~~LOC~~ SOAJ
1.0000 mg | SUBCUTANEOUS | 1 refills | Status: AC
  Filled 2024-06-03 – 2024-06-07 (×2): qty 2, 28d supply, fill #0

## 2024-06-05 ENCOUNTER — Other Ambulatory Visit: Payer: Self-pay | Admitting: Pharmacy Technician

## 2024-06-05 ENCOUNTER — Other Ambulatory Visit: Payer: Self-pay

## 2024-06-05 NOTE — Progress Notes (Signed)
 Specialty Pharmacy Refill Coordination Note  Bailey Morris is a 53 y.o. female contacted today regarding refills of specialty medication(s) Abacavir -Dolutegravir -Lamivud ([name] )   Patient requested Delivery   Delivery date: 06/07/24   Verified address: [address]  [place]   Medication will be filled on: 06/06/24

## 2024-06-07 ENCOUNTER — Other Ambulatory Visit (HOSPITAL_BASED_OUTPATIENT_CLINIC_OR_DEPARTMENT_OTHER): Payer: Self-pay

## 2024-06-07 ENCOUNTER — Other Ambulatory Visit (HOSPITAL_COMMUNITY): Payer: Self-pay

## 2024-06-28 ENCOUNTER — Other Ambulatory Visit: Payer: Self-pay

## 2024-07-02 ENCOUNTER — Other Ambulatory Visit: Payer: Self-pay

## 2024-07-02 NOTE — Progress Notes (Signed)
 Specialty Pharmacy Refill Coordination Note  Bailey Morris is a 54 y.o. female contacted today regarding refills of specialty medication(s) Abacavir -Dolutegravir -Lamivud ([name] )   Patient requested Delivery   Delivery date: 07/05/24   Verified address: [address]  [place]   Medication will be filled on: 07/04/24

## 2024-07-02 NOTE — Progress Notes (Signed)
 Specialty Pharmacy Ongoing Clinical Assessment Note  Bailey Morris is a 54 y.o. female who is being followed by the specialty pharmacy service for RxSp HIV   Patient's specialty medication(s) reviewed today: Abacavir -Dolutegravir -Lamivud ([name] )   Missed doses in the last 4 weeks: 0   Patient/Caregiver did not have any additional questions or concerns.   Therapeutic benefit summary: Patient is achieving benefit   Adverse events/side effects summary: No adverse events/side effects   Patient's therapy is appropriate to: Continue    Goals Addressed             This Visit's Progress    Achieve Undetectable HIV Viral Load < 20   On track    Patient is on track. Patient will maintain adherence.  Patient's viral load remains undetectable long-term, last lab value was from 09/18/23.         Follow up: 12 months  Silvano LOISE Dolly Specialty Pharmacist

## 2024-07-04 ENCOUNTER — Other Ambulatory Visit: Payer: Self-pay

## 2024-07-26 ENCOUNTER — Other Ambulatory Visit: Payer: Self-pay

## 2024-09-11 ENCOUNTER — Other Ambulatory Visit

## 2024-09-25 ENCOUNTER — Ambulatory Visit: Payer: Self-pay | Admitting: Infectious Diseases
